# Patient Record
Sex: Female | Born: 1965 | Race: White | Hispanic: No | Smoking: Never smoker
Health system: Southern US, Community
[De-identification: ages and names within clinical notes are randomized; demographics above are authoritative.]

## PROBLEM LIST (undated history)

## (undated) DIAGNOSIS — E785 Hyperlipidemia, unspecified: Secondary | ICD-10-CM

## (undated) DIAGNOSIS — Q211 Atrial septal defect, unspecified: Secondary | ICD-10-CM

## (undated) DIAGNOSIS — I639 Cerebral infarction, unspecified: Secondary | ICD-10-CM

## (undated) HISTORY — DX: Cerebral infarction, unspecified: I63.9

## (undated) HISTORY — DX: Hyperlipidemia, unspecified: E78.5

## (undated) HISTORY — PX: BREAST EXCISIONAL BIOPSY: SUR124

---
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
## 2001-01-27 HISTORY — PX: BREAST SURGERY: SHX581

## 2006-08-07 ENCOUNTER — Ambulatory Visit (HOSPITAL_COMMUNITY): Admission: AD | Admit: 2006-08-07 | Discharge: 2006-08-07 | Payer: Self-pay | Admitting: Obstetrics and Gynecology

## 2006-08-07 ENCOUNTER — Encounter (INDEPENDENT_AMBULATORY_CARE_PROVIDER_SITE_OTHER): Payer: Self-pay | Admitting: Obstetrics and Gynecology

## 2007-07-08 ENCOUNTER — Ambulatory Visit (HOSPITAL_COMMUNITY): Admission: RE | Admit: 2007-07-08 | Discharge: 2007-07-08 | Payer: Self-pay | Admitting: Obstetrics and Gynecology

## 2008-09-12 ENCOUNTER — Ambulatory Visit (HOSPITAL_COMMUNITY): Admission: RE | Admit: 2008-09-12 | Discharge: 2008-09-12 | Payer: Self-pay | Admitting: Obstetrics and Gynecology

## 2009-01-27 DIAGNOSIS — I639 Cerebral infarction, unspecified: Secondary | ICD-10-CM

## 2009-01-27 HISTORY — DX: Cerebral infarction, unspecified: I63.9

## 2009-01-27 HISTORY — PX: PATENT FORAMEN OVALE CLOSURE: SHX2181

## 2009-02-23 DIAGNOSIS — R531 Weakness: Secondary | ICD-10-CM | POA: Insufficient documentation

## 2009-02-23 DIAGNOSIS — I639 Cerebral infarction, unspecified: Secondary | ICD-10-CM | POA: Insufficient documentation

## 2009-05-23 ENCOUNTER — Encounter (INDEPENDENT_AMBULATORY_CARE_PROVIDER_SITE_OTHER): Payer: Self-pay | Admitting: Internal Medicine

## 2009-05-24 ENCOUNTER — Encounter (INDEPENDENT_AMBULATORY_CARE_PROVIDER_SITE_OTHER): Payer: Self-pay | Admitting: Neurology

## 2009-05-25 ENCOUNTER — Ambulatory Visit: Payer: Self-pay | Admitting: Physical Medicine & Rehabilitation

## 2009-05-25 ENCOUNTER — Ambulatory Visit: Payer: Self-pay | Admitting: Vascular Surgery

## 2009-05-29 ENCOUNTER — Inpatient Hospital Stay (HOSPITAL_COMMUNITY)
Admission: RE | Admit: 2009-05-29 | Discharge: 2009-06-21 | Payer: Self-pay | Admitting: Physical Medicine & Rehabilitation

## 2009-05-30 ENCOUNTER — Ambulatory Visit: Payer: Self-pay | Admitting: Physical Medicine & Rehabilitation

## 2009-06-05 ENCOUNTER — Ambulatory Visit: Payer: Self-pay | Admitting: Physical Medicine & Rehabilitation

## 2009-06-17 ENCOUNTER — Encounter: Payer: Self-pay | Admitting: Internal Medicine

## 2009-06-17 ENCOUNTER — Ambulatory Visit: Payer: Self-pay | Admitting: Vascular Surgery

## 2009-06-19 ENCOUNTER — Encounter: Payer: Self-pay | Admitting: Internal Medicine

## 2010-01-03 ENCOUNTER — Inpatient Hospital Stay (HOSPITAL_COMMUNITY): Admission: EM | Admit: 2010-01-03 | Discharge: 2009-05-29 | Payer: Self-pay | Admitting: Internal Medicine

## 2010-02-17 ENCOUNTER — Encounter: Payer: Self-pay | Admitting: Physical Medicine & Rehabilitation

## 2010-02-18 ENCOUNTER — Encounter: Payer: Self-pay | Admitting: Obstetrics and Gynecology

## 2010-04-15 LAB — PROTIME-INR
INR: 1.03 (ref 0.00–1.49)
INR: 1.32 (ref 0.00–1.49)
INR: 1.51 — ABNORMAL HIGH (ref 0.00–1.49)
Prothrombin Time: 16.3 seconds — ABNORMAL HIGH (ref 11.6–15.2)
Prothrombin Time: 18.1 seconds — ABNORMAL HIGH (ref 11.6–15.2)

## 2010-04-15 LAB — CBC
HCT: 38.1 % (ref 36.0–46.0)
Hemoglobin: 13.5 g/dL (ref 12.0–15.0)
MCHC: 35.3 g/dL (ref 30.0–36.0)
MCV: 91.6 fL (ref 78.0–100.0)
Platelets: 167 10*3/uL (ref 150–400)
Platelets: 199 10*3/uL (ref 150–400)
RBC: 4.16 MIL/uL (ref 3.87–5.11)
RDW: 13 % (ref 11.5–15.5)
WBC: 7.3 10*3/uL (ref 4.0–10.5)

## 2010-04-15 LAB — HCG, SERUM, QUALITATIVE: Preg, Serum: NEGATIVE

## 2010-04-15 LAB — APTT: aPTT: 35 seconds (ref 24–37)

## 2010-04-16 LAB — COMPREHENSIVE METABOLIC PANEL
ALT: 21 U/L (ref 0–35)
ALT: 28 U/L (ref 0–35)
AST: 29 U/L (ref 0–37)
Albumin: 3 g/dL — ABNORMAL LOW (ref 3.5–5.2)
Albumin: 3.4 g/dL — ABNORMAL LOW (ref 3.5–5.2)
Alkaline Phosphatase: 39 U/L (ref 39–117)
BUN: 11 mg/dL (ref 6–23)
CO2: 33 mEq/L — ABNORMAL HIGH (ref 19–32)
CO2: 26 mEq/L (ref 19–32)
Calcium: 8.8 mg/dL (ref 8.4–10.5)
Calcium: 8.8 mg/dL (ref 8.4–10.5)
Chloride: 102 mEq/L (ref 96–112)
Chloride: 107 mEq/L (ref 96–112)
Creatinine, Ser: 0.87 mg/dL (ref 0.4–1.2)
Creatinine, Ser: 0.76 mg/dL (ref 0.4–1.2)
GFR calc non Af Amer: >60 mL/min (ref >60)
GFR calc non Af Amer: >60 mL/min (ref >60)
GFR calc non Af Amer: >60 mL/min (ref >60)
Glucose, Bld: 90 mg/dL (ref 70–99)
Glucose, Bld: 103 mg/dL — ABNORMAL HIGH (ref 70–99)
Glucose, Bld: 84 mg/dL (ref 70–99)
Potassium: 3.9 mEq/L (ref 3.5–5.1)
Sodium: 138 mEq/L (ref 135–145)
Total Bilirubin: 0.6 mg/dL (ref 0.3–1.2)
Total Bilirubin: 0.3 mg/dL (ref 0.3–1.2)
Total Protein: 6.3 g/dL (ref 6.0–8.3)
Total Protein: 6.5 g/dL (ref 6.0–8.3)

## 2010-04-16 LAB — DIFFERENTIAL
Basophils Absolute: 0 10*3/uL (ref 0.0–0.1)
Basophils Relative: 0 % (ref 0–1)
Eosinophils Absolute: 0 10*3/uL (ref 0.0–0.7)
Eosinophils Relative: 0 % (ref 0–5)
Lymphocytes Relative: 35 % (ref 12–46)
Lymphocytes Relative: 7 % — ABNORMAL LOW (ref 12–46)
Lymphs Abs: 0.7 10*3/uL (ref 0.7–4.0)
Monocytes Absolute: 0.5 10*3/uL (ref 0.1–1.0)
Monocytes Relative: 9 % (ref 3–12)
Monocytes Relative: 3 % (ref 3–12)
Neutro Abs: 2.7 10*3/uL (ref 1.7–7.7)
Neutro Abs: 9.9 10*3/uL — ABNORMAL HIGH (ref 1.7–7.7)
Neutrophils Relative %: 50 % (ref 43–77)
Neutrophils Relative %: 91 % — ABNORMAL HIGH (ref 43–77)

## 2010-04-16 LAB — BASIC METABOLIC PANEL
BUN: 10 mg/dL (ref 6–23)
BUN: 11 mg/dL (ref 6–23)
CO2: 35 mEq/L — ABNORMAL HIGH (ref 19–32)
Calcium: 8.7 mg/dL (ref 8.4–10.5)
Calcium: 8.4 mg/dL (ref 8.4–10.5)
Chloride: 104 mEq/L (ref 96–112)
Chloride: 105 mEq/L (ref 96–112)
Chloride: 109 mEq/L (ref 96–112)
Creatinine, Ser: 0.86 mg/dL (ref 0.4–1.2)
Creatinine, Ser: 0.96 mg/dL (ref 0.4–1.2)
Creatinine, Ser: 0.8 mg/dL (ref 0.4–1.2)
GFR calc Af Amer: >60 mL/min (ref >60)
GFR calc Af Amer: >60 mL/min (ref >60)
GFR calc Af Amer: >60 mL/min (ref >60)
GFR calc Af Amer: >60 mL/min (ref >60)
GFR calc non Af Amer: >60 mL/min (ref >60)
GFR calc non Af Amer: >60 mL/min (ref >60)
GFR calc non Af Amer: >60 mL/min (ref >60)
Glucose, Bld: 91 mg/dL (ref 70–99)
Glucose, Bld: 93 mg/dL (ref 70–99)
Potassium: 3.7 mEq/L (ref 3.5–5.1)
Potassium: 3.3 mEq/L — ABNORMAL LOW (ref 3.5–5.1)
Sodium: 141 mEq/L (ref 135–145)
Sodium: 142 mEq/L (ref 135–145)
Sodium: 144 mEq/L (ref 135–145)

## 2010-04-16 LAB — BLOOD GAS, ARTERIAL
Acid-Base Excess: 0.7 mmol/L (ref 0.0–2.0)
Bicarbonate: 24.4 mEq/L — ABNORMAL HIGH (ref 20.0–24.0)
Drawn by: 307971
TCO2: 21.9 mmol/L (ref 0–100)
pCO2 arterial: 37.7 mmHg (ref 35.0–45.0)
pH, Arterial: 7.428 — ABNORMAL HIGH (ref 7.350–7.400)

## 2010-04-16 LAB — PROTIME-INR: Prothrombin Time: 13 seconds (ref 11.6–15.2)

## 2010-04-16 LAB — CBC
HCT: 33 % — ABNORMAL LOW (ref 36.0–46.0)
HCT: 36.3 % (ref 36.0–46.0)
Hemoglobin: 11.6 g/dL — ABNORMAL LOW (ref 12.0–15.0)
Hemoglobin: 12.3 g/dL (ref 12.0–15.0)
Hemoglobin: 12.8 g/dL (ref 12.0–15.0)
MCHC: 34.2 g/dL (ref 30.0–36.0)
MCHC: 35 g/dL (ref 30.0–36.0)
MCHC: 34.4 g/dL (ref 30.0–36.0)
MCV: 91.7 fL (ref 78.0–100.0)
MCV: 90.9 fL (ref 78.0–100.0)
MCV: 91.8 fL (ref 78.0–100.0)
Platelets: 204 10*3/uL (ref 150–400)
Platelets: 212 10*3/uL (ref 150–400)
RBC: 3.61 MIL/uL — ABNORMAL LOW (ref 3.87–5.11)
RBC: 4 MIL/uL (ref 3.87–5.11)
RBC: 4.06 MIL/uL (ref 3.87–5.11)
RDW: 13.2 % (ref 11.5–15.5)
RDW: 13.4 % (ref 11.5–15.5)
WBC: 5.5 10*3/uL (ref 4.0–10.5)
WBC: 6.2 10*3/uL (ref 4.0–10.5)

## 2010-04-16 LAB — URINALYSIS, ROUTINE W REFLEX MICROSCOPIC
Bilirubin Urine: NEGATIVE
Glucose, UA: NEGATIVE mg/dL
Leukocytes, UA: NEGATIVE
Leukocytes, UA: NEGATIVE
Nitrite: NEGATIVE
Protein, ur: NEGATIVE mg/dL
Specific Gravity, Urine: 1.017 (ref 1.005–1.030)
pH: 6.5 (ref 5.0–8.0)
pH: 7 (ref 5.0–8.0)

## 2010-04-16 LAB — HEMOGLOBIN A1C
Hgb A1c MFr Bld: 5.5 % (ref <5.7)
Mean Plasma Glucose: 111 mg/dL (ref <117)

## 2010-04-16 LAB — EXTRACTABLE NUCLEAR ANTIGEN ANTIBODY
SSA (Ro) (ENA) Antibody, IgG: <0.2 AI (ref <1.0)
Scleroderma (Scl-70) (ENA) Antibody, IgG: <0.2 AI (ref <1.0)

## 2010-04-16 LAB — CARDIOLIPIN ANTIBODIES, IGG, IGM, IGA
Anticardiolipin IgA: 3 APL U/mL — ABNORMAL LOW (ref <22)
Anticardiolipin IgG: 6 GPL U/mL — ABNORMAL LOW (ref <23)

## 2010-04-16 LAB — LIPID PANEL
HDL: 65 mg/dL (ref >39)
LDL Cholesterol: 122 mg/dL — ABNORMAL HIGH (ref 0–99)
Total CHOL/HDL Ratio: 3 RATIO
Triglycerides: 46 mg/dL (ref <150)
VLDL: 9 mg/dL (ref 0–40)

## 2010-04-16 LAB — OSMOLALITY: Osmolality: 282 mOsm/kg (ref 275–300)

## 2010-04-16 LAB — URINE MICROSCOPIC-ADD ON

## 2010-04-16 LAB — CARDIAC PANEL(CRET KIN+CKTOT+MB+TROPI)
CK, MB: 4.6 ng/mL — ABNORMAL HIGH (ref 0.3–4.0)
Relative Index: 1 (ref 0.0–2.5)
Total CK: 2362 U/L — ABNORMAL HIGH (ref 7–177)
Troponin I: 0.01 ng/mL (ref 0.00–0.06)

## 2010-04-16 LAB — URINE CULTURE: Colony Count: NO GROWTH

## 2010-04-16 LAB — RAPID URINE DRUG SCREEN, HOSP PERFORMED
Barbiturates: NOT DETECTED
Barbiturates: NOT DETECTED
Benzodiazepines: NOT DETECTED

## 2010-04-16 LAB — ACETAMINOPHEN LEVEL: Acetaminophen (Tylenol), Serum: <10 ug/mL — ABNORMAL LOW (ref 10–30)

## 2010-04-16 LAB — HOMOCYSTEINE: Homocysteine: 5.1 umol/L (ref 4.0–15.4)

## 2010-04-16 LAB — C-REACTIVE PROTEIN: CRP: 0.8 mg/dL — ABNORMAL HIGH (ref <0.6)

## 2010-04-16 LAB — SALICYLATE LEVEL: Salicylate Lvl: <4 mg/dL (ref 2.8–20.0)

## 2010-04-16 LAB — ETHANOL: Alcohol, Ethyl (B): <5 mg/dL (ref 0–10)

## 2010-04-16 LAB — JO-1 ANTIBODY-IGG: Jo-1 Antibody, IgG: <0.2 AI (ref <1.0)

## 2010-04-16 LAB — FACTOR 5 LEIDEN

## 2010-04-16 LAB — SEDIMENTATION RATE: Sed Rate: 8 mm/hr (ref 0–22)

## 2010-06-11 NOTE — Op Note (Signed)
NAME:  Connie Avery, Connie Avery NO.:  [id number]   MEDICAL RECORD NO.:  [id number]          PATIENT TYPE:  AMB   LOCATION:                                FACILITY:  [facility]   PHYSICIAN:  Lenoard Aden, M.D.DATE OF BIRTH:  [birth year]   DATE OF PROCEDURE:  08/07/2006  DATE OF DISCHARGE:  08/07/2006                               OPERATIVE REPORT   PREOPERATIVE DIAGNOSIS:  Missed abortion at nine weeks.   POSTOPERATIVE DIAGNOSIS:  Missed abortion at nine weeks.   PROCEDURE:  Suction dilatation and evacuation.   SURGEON:  Lenoard Aden, M.D.   ANESTHESIA:  MAC, paracervical block.   ESTIMATED BLOOD LOSS:  Less than 50 mL.   COMPLICATIONS:  None.   DRAINS:  None.   COUNTS:  Correct.   DISPOSITION:  The patient to the recovery room in good condition.   DESCRIPTION OF PROCEDURE:  Being appraised of the risks of anesthesia,  infection, bleeding, injury to abdominal organs, need for reparative  surgery, and immediate complications to include bowel and bladder  injury.  The patient was brought to the operating room and was  administered an IV sedation without difficulty.  She was prepped and  draped in the usual sterile fashion.  Catheterized until the bladder was  empty.  Examination under anesthesia revealed an eight to 10-week size  uterus.  No adnexal masses.  The cervix is dilated easily up to a #25  Pratt dilator.  A suction curet placed.  Products of conception noted.  Repeat suction and curettage in a four-quadrant method revealed the  cavity to be empty.  Good hemostasis was noted.  All instruments were  removed.   The patient tolerated the procedure well and was transferred to the  recovery room in good condition.      Lenoard Aden, M.D.  Electronically Signed     RJT/MEDQ  D:  08/07/2006  T:  08/09/2006  Job:  [id number]

## 2010-10-23 ENCOUNTER — Ambulatory Visit: Payer: PRIVATE HEALTH INSURANCE | Admitting: Occupational Therapy

## 2010-10-23 ENCOUNTER — Ambulatory Visit: Payer: PRIVATE HEALTH INSURANCE | Attending: Cardiovascular Disease | Admitting: Physical Therapy

## 2010-10-23 DIAGNOSIS — I69998 Other sequelae following unspecified cerebrovascular disease: Secondary | ICD-10-CM | POA: Insufficient documentation

## 2010-10-23 DIAGNOSIS — R269 Unspecified abnormalities of gait and mobility: Secondary | ICD-10-CM | POA: Insufficient documentation

## 2010-10-23 DIAGNOSIS — Z5189 Encounter for other specified aftercare: Secondary | ICD-10-CM | POA: Insufficient documentation

## 2010-10-23 DIAGNOSIS — M2420 Disorder of ligament, unspecified site: Secondary | ICD-10-CM | POA: Insufficient documentation

## 2010-10-23 DIAGNOSIS — M6281 Muscle weakness (generalized): Secondary | ICD-10-CM | POA: Insufficient documentation

## 2010-10-23 DIAGNOSIS — R279 Unspecified lack of coordination: Secondary | ICD-10-CM | POA: Insufficient documentation

## 2010-10-23 DIAGNOSIS — M629 Disorder of muscle, unspecified: Secondary | ICD-10-CM | POA: Insufficient documentation

## 2010-10-29 ENCOUNTER — Ambulatory Visit: Payer: PRIVATE HEALTH INSURANCE | Attending: Cardiovascular Disease | Admitting: Occupational Therapy

## 2010-10-29 DIAGNOSIS — M6281 Muscle weakness (generalized): Secondary | ICD-10-CM | POA: Insufficient documentation

## 2010-10-29 DIAGNOSIS — I69998 Other sequelae following unspecified cerebrovascular disease: Secondary | ICD-10-CM | POA: Insufficient documentation

## 2010-10-29 DIAGNOSIS — M629 Disorder of muscle, unspecified: Secondary | ICD-10-CM | POA: Insufficient documentation

## 2010-10-29 DIAGNOSIS — R279 Unspecified lack of coordination: Secondary | ICD-10-CM | POA: Insufficient documentation

## 2010-10-29 DIAGNOSIS — M2420 Disorder of ligament, unspecified site: Secondary | ICD-10-CM | POA: Insufficient documentation

## 2010-10-29 DIAGNOSIS — R269 Unspecified abnormalities of gait and mobility: Secondary | ICD-10-CM | POA: Insufficient documentation

## 2010-10-29 DIAGNOSIS — Z5189 Encounter for other specified aftercare: Secondary | ICD-10-CM | POA: Insufficient documentation

## 2010-10-30 ENCOUNTER — Ambulatory Visit: Payer: PRIVATE HEALTH INSURANCE | Admitting: *Deleted

## 2010-11-05 ENCOUNTER — Ambulatory Visit: Payer: PRIVATE HEALTH INSURANCE | Admitting: Rehabilitative and Restorative Service Providers"

## 2010-11-05 ENCOUNTER — Ambulatory Visit: Payer: PRIVATE HEALTH INSURANCE | Admitting: Occupational Therapy

## 2010-11-06 ENCOUNTER — Ambulatory Visit: Payer: PRIVATE HEALTH INSURANCE | Admitting: Physical Therapy

## 2010-11-06 ENCOUNTER — Encounter: Payer: PRIVATE HEALTH INSURANCE | Admitting: Occupational Therapy

## 2010-11-11 ENCOUNTER — Ambulatory Visit: Payer: PRIVATE HEALTH INSURANCE | Admitting: Physical Therapy

## 2010-11-11 ENCOUNTER — Ambulatory Visit: Payer: PRIVATE HEALTH INSURANCE | Admitting: Occupational Therapy

## 2010-11-12 LAB — CBC
HCT: 35.2 — ABNORMAL LOW
Hemoglobin: 11.7 — ABNORMAL LOW
MCV: 84.2
Platelets: 260
RDW: 17.5 — ABNORMAL HIGH
WBC: 8.7

## 2010-11-12 LAB — RH IMMUNE GLOBULIN WORKUP (NOT WOMEN'S HOSP): Antibody Screen: NEGATIVE

## 2010-11-13 ENCOUNTER — Encounter: Payer: PRIVATE HEALTH INSURANCE | Admitting: Occupational Therapy

## 2010-11-13 ENCOUNTER — Ambulatory Visit: Payer: PRIVATE HEALTH INSURANCE | Admitting: Physical Therapy

## 2010-11-18 ENCOUNTER — Ambulatory Visit: Payer: PRIVATE HEALTH INSURANCE | Admitting: Physical Therapy

## 2010-11-18 ENCOUNTER — Encounter: Payer: PRIVATE HEALTH INSURANCE | Admitting: Occupational Therapy

## 2010-11-21 ENCOUNTER — Ambulatory Visit: Payer: PRIVATE HEALTH INSURANCE | Admitting: Rehabilitative and Restorative Service Providers"

## 2010-11-21 ENCOUNTER — Ambulatory Visit: Payer: PRIVATE HEALTH INSURANCE | Admitting: Occupational Therapy

## 2010-12-03 ENCOUNTER — Encounter: Payer: PRIVATE HEALTH INSURANCE | Admitting: Occupational Therapy

## 2010-12-03 ENCOUNTER — Ambulatory Visit: Payer: PRIVATE HEALTH INSURANCE | Attending: Cardiovascular Disease | Admitting: Physical Therapy

## 2010-12-03 DIAGNOSIS — R279 Unspecified lack of coordination: Secondary | ICD-10-CM | POA: Insufficient documentation

## 2010-12-03 DIAGNOSIS — R269 Unspecified abnormalities of gait and mobility: Secondary | ICD-10-CM | POA: Insufficient documentation

## 2010-12-03 DIAGNOSIS — I69998 Other sequelae following unspecified cerebrovascular disease: Secondary | ICD-10-CM | POA: Insufficient documentation

## 2010-12-03 DIAGNOSIS — M629 Disorder of muscle, unspecified: Secondary | ICD-10-CM | POA: Insufficient documentation

## 2010-12-03 DIAGNOSIS — Z5189 Encounter for other specified aftercare: Secondary | ICD-10-CM | POA: Insufficient documentation

## 2010-12-03 DIAGNOSIS — M2420 Disorder of ligament, unspecified site: Secondary | ICD-10-CM | POA: Insufficient documentation

## 2010-12-03 DIAGNOSIS — M6281 Muscle weakness (generalized): Secondary | ICD-10-CM | POA: Insufficient documentation

## 2010-12-10 ENCOUNTER — Encounter: Payer: PRIVATE HEALTH INSURANCE | Admitting: Occupational Therapy

## 2010-12-10 ENCOUNTER — Ambulatory Visit: Payer: PRIVATE HEALTH INSURANCE | Admitting: Physical Therapy

## 2010-12-17 ENCOUNTER — Encounter: Payer: PRIVATE HEALTH INSURANCE | Admitting: Occupational Therapy

## 2010-12-17 ENCOUNTER — Ambulatory Visit: Payer: PRIVATE HEALTH INSURANCE | Admitting: Rehabilitative and Restorative Service Providers"

## 2010-12-24 ENCOUNTER — Ambulatory Visit: Payer: PRIVATE HEALTH INSURANCE | Admitting: Physical Therapy

## 2010-12-31 ENCOUNTER — Ambulatory Visit: Payer: PRIVATE HEALTH INSURANCE | Attending: Cardiovascular Disease | Admitting: Physical Therapy

## 2010-12-31 DIAGNOSIS — Z5189 Encounter for other specified aftercare: Secondary | ICD-10-CM | POA: Insufficient documentation

## 2010-12-31 DIAGNOSIS — R269 Unspecified abnormalities of gait and mobility: Secondary | ICD-10-CM | POA: Insufficient documentation

## 2010-12-31 DIAGNOSIS — M629 Disorder of muscle, unspecified: Secondary | ICD-10-CM | POA: Insufficient documentation

## 2010-12-31 DIAGNOSIS — M6281 Muscle weakness (generalized): Secondary | ICD-10-CM | POA: Insufficient documentation

## 2010-12-31 DIAGNOSIS — M2420 Disorder of ligament, unspecified site: Secondary | ICD-10-CM | POA: Insufficient documentation

## 2010-12-31 DIAGNOSIS — R279 Unspecified lack of coordination: Secondary | ICD-10-CM | POA: Insufficient documentation

## 2010-12-31 DIAGNOSIS — I69998 Other sequelae following unspecified cerebrovascular disease: Secondary | ICD-10-CM | POA: Insufficient documentation

## 2011-01-07 ENCOUNTER — Ambulatory Visit: Payer: PRIVATE HEALTH INSURANCE | Admitting: Physical Therapy

## 2011-01-14 ENCOUNTER — Ambulatory Visit: Payer: PRIVATE HEALTH INSURANCE | Admitting: Physical Therapy

## 2011-11-21 ENCOUNTER — Ambulatory Visit (HOSPITAL_COMMUNITY)
Admission: RE | Admit: 2011-11-21 | Discharge: 2011-11-21 | Disposition: A | Discharge status: Home / Self Care | Payer: Medicare Other | Source: Ambulatory Visit | Attending: Cardiovascular Disease | Admitting: Cardiovascular Disease

## 2011-11-21 ENCOUNTER — Other Ambulatory Visit (HOSPITAL_COMMUNITY): Payer: Self-pay | Admitting: Cardiovascular Disease

## 2011-11-21 DIAGNOSIS — R200 Anesthesia of skin: Secondary | ICD-10-CM

## 2011-11-21 DIAGNOSIS — M5090 Cervical disc disorder, unspecified, unspecified cervical region: Secondary | ICD-10-CM | POA: Insufficient documentation

## 2011-11-21 DIAGNOSIS — R209 Unspecified disturbances of skin sensation: Secondary | ICD-10-CM | POA: Insufficient documentation

## 2012-02-13 ENCOUNTER — Other Ambulatory Visit: Payer: Self-pay | Admitting: Obstetrics and Gynecology

## 2012-02-17 ENCOUNTER — Ambulatory Visit: Payer: PRIVATE HEALTH INSURANCE | Admitting: Family

## 2012-02-18 ENCOUNTER — Ambulatory Visit: Payer: Medicare Other | Admitting: Family

## 2012-02-24 ENCOUNTER — Ambulatory Visit (INDEPENDENT_AMBULATORY_CARE_PROVIDER_SITE_OTHER): Payer: Medicare Other | Admitting: Family

## 2012-02-24 ENCOUNTER — Encounter: Payer: Self-pay | Admitting: Family

## 2012-02-24 VITALS — BP 100/60 | HR 68 | Temp 98.1°F | Resp 18 | Ht 65.5 in | Wt 127.0 lb

## 2012-02-24 DIAGNOSIS — I6350 Cerebral infarction due to unspecified occlusion or stenosis of unspecified cerebral artery: Secondary | ICD-10-CM

## 2012-02-24 DIAGNOSIS — Z8673 Personal history of transient ischemic attack (TIA), and cerebral infarction without residual deficits: Secondary | ICD-10-CM

## 2012-02-24 DIAGNOSIS — R531 Weakness: Secondary | ICD-10-CM

## 2012-02-24 DIAGNOSIS — I639 Cerebral infarction, unspecified: Secondary | ICD-10-CM

## 2012-02-24 DIAGNOSIS — M6281 Muscle weakness (generalized): Secondary | ICD-10-CM

## 2012-02-24 DIAGNOSIS — Z8774 Personal history of (corrected) congenital malformations of heart and circulatory system: Secondary | ICD-10-CM

## 2012-02-24 DIAGNOSIS — Z9889 Other specified postprocedural states: Secondary | ICD-10-CM

## 2012-02-24 NOTE — Progress Notes (Signed)
Subjective:    Patient ID: Connie Avery, female    DOB: 10/22/1965, 47 y.o.   MRN: [id number]  HPI 47 year old white female, nonsmoker, new patient to the practice is in to be established. 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 However, she communicates well. She sees gynecology for pelvic and Pap smears. Has a mammogram scheduled for next month. Patient is originally from [place]. One month prior to her having a stroke her husband died of polycythemia. She appears to be coping well and socializing well.  Patient reports having a heart surgery in 2011 that she allegedly had a whole patch and hard. Denies any chest pain, palpitations, shortness of breath or edema. Per hospital record review, patient had a large patent Foramen Ovale causing shunting that was repaired.    Review of Systems  HENT: Negative.   Respiratory: Negative.   Cardiovascular: Negative.   Gastrointestinal: Negative.   Genitourinary: Negative.   Musculoskeletal: Negative.   Skin: Negative.   Neurological: Positive for weakness. Negative for dizziness and light-headedness.       Right side residual weakness   Hematological: Negative.   Psychiatric/Behavioral: Negative.    Past Medical History  Diagnosis Date  . Stroke 2011    History   Social History  . Marital Status: Widowed    Spouse Name: N/A    Number of Children: N/A  . Years of Education: N/A   Occupational History  . Not on file.   Social History Main Topics  . Smoking status: Never Smoker   . Smokeless tobacco: Never Used  . Alcohol Use: No  . Drug Use: No  . Sexually Active: Not on file   Other Topics Concern  . Not on file   Social History Narrative  . No narrative on file    Past Surgical History  Procedure Date  . Hernia repair 1965    Left  Inguinal  . Hernia repair 1966    Umbilical  . Breast surgery 2003    Left Lumpectomy    No family history on  file.  Allergies  Allergen Reactions  . Claritin (Loratadine) Nausea And Vomiting    No current outpatient prescriptions on file prior to visit.    BP 100/60  Pulse 68  Temp 98.1 F (36.7 C) (Oral)  Resp 18  Ht 5' 5.5" (1.664 m)  Wt 127 lb (57.607 kg)  BMI 20.81 kg/m2chart    Objective:   Physical Exam  Constitutional: She is oriented to person, place, and time. She appears well-developed and well-nourished.  HENT:  Right Ear: External ear normal.  Left Ear: External ear normal.  Nose: Nose normal.  Neck: Normal range of motion. Neck supple.  Cardiovascular: Normal rate, regular rhythm and normal heart sounds.   Pulmonary/Chest: Effort normal and breath sounds normal.  Abdominal: Soft. Bowel sounds are normal.  Musculoskeletal: Normal range of motion.       Right-sided weakness. Ambulates well with a length. Grip strength  Two out of three on the right  Neurological: She is alert and oriented to person, place, and time. She has normal reflexes. No cranial nerve deficit. Coordination normal.  Skin: Skin is warm and dry.  Psychiatric: She has a normal mood and affect.          Assessment & Plan:  Assessment:  1. History of CVA 2. Foramen Ovale- Repaired (ASD) 3. Right Side Weakness  Plan: Return in 4 weeks for complete physical exam.  Continue current medications. Follow up with gynecology for GYN care.

## 2012-03-22 ENCOUNTER — Encounter: Payer: Medicare Other | Admitting: Family

## 2012-03-26 ENCOUNTER — Ambulatory Visit (INDEPENDENT_AMBULATORY_CARE_PROVIDER_SITE_OTHER): Payer: Medicare Other | Admitting: Family

## 2012-03-26 ENCOUNTER — Encounter: Payer: Self-pay | Admitting: Family

## 2012-03-26 ENCOUNTER — Other Ambulatory Visit: Payer: Self-pay | Admitting: Obstetrics and Gynecology

## 2012-03-26 VITALS — BP 110/70 | Temp 97.4°F | Wt 125.0 lb

## 2012-03-26 DIAGNOSIS — Z8673 Personal history of transient ischemic attack (TIA), and cerebral infarction without residual deficits: Secondary | ICD-10-CM

## 2012-03-26 DIAGNOSIS — Q2111 Secundum atrial septal defect: Secondary | ICD-10-CM

## 2012-03-26 DIAGNOSIS — Z1231 Encounter for screening mammogram for malignant neoplasm of breast: Secondary | ICD-10-CM

## 2012-03-26 DIAGNOSIS — M6281 Muscle weakness (generalized): Secondary | ICD-10-CM

## 2012-03-26 DIAGNOSIS — E7800 Pure hypercholesterolemia, unspecified: Secondary | ICD-10-CM

## 2012-03-26 DIAGNOSIS — Z0000 Encounter for general adult medical examination without abnormal findings: Secondary | ICD-10-CM

## 2012-03-26 LAB — LIPID PANEL
LDL Cholesterol: 126 mg/dL — ABNORMAL HIGH (ref 0–99)
Total CHOL/HDL Ratio: 4
Triglycerides: 40 mg/dL (ref 0.0–149.0)

## 2012-03-26 LAB — HEPATIC FUNCTION PANEL
ALT: 17 U/L (ref 0–35)
AST: 21 U/L (ref 0–37)
Alkaline Phosphatase: 54 U/L (ref 39–117)
Bilirubin, Direct: 0.1 mg/dL (ref 0.0–0.3)
Total Bilirubin: 0.8 mg/dL (ref 0.3–1.2)
Total Protein: 7.1 g/dL (ref 6.0–8.3)

## 2012-03-26 LAB — BASIC METABOLIC PANEL
CO2: 31 mEq/L (ref 19–32)
Chloride: 104 mEq/L (ref 96–112)
Potassium: 3.8 mEq/L (ref 3.5–5.1)
Sodium: 143 mEq/L (ref 135–145)

## 2012-03-26 LAB — CBC WITH DIFFERENTIAL/PLATELET
Basophils Absolute: 0 10*3/uL (ref 0.0–0.1)
Eosinophils Relative: 6.5 % — ABNORMAL HIGH (ref 0.0–5.0)
HCT: 35.6 % — ABNORMAL LOW (ref 36.0–46.0)
Lymphocytes Relative: 34.8 % (ref 12.0–46.0)
Lymphs Abs: 1.4 10*3/uL (ref 0.7–4.0)
Monocytes Relative: 8.8 % (ref 3.0–12.0)
Platelets: 204 10*3/uL (ref 150.0–400.0)
RDW: 13.1 % (ref 11.5–14.6)
WBC: 4 10*3/uL — ABNORMAL LOW (ref 4.5–10.5)

## 2012-03-26 LAB — TSH: TSH: 2.91 u[IU]/mL (ref 0.35–5.50)

## 2012-03-26 NOTE — Patient Instructions (Addendum)

## 2012-03-26 NOTE — Progress Notes (Signed)
Subjective:    Patient ID: Connie Avery, female    DOB: [birth year], 47 y.o.   MRN: [id number]  HPI 47 year old white female, nonsmoker is in for complete physical exam. Denies any concerns.  Reviewed all health maintenance protocols including mammography colonoscopy bone density and reviewed appropriate screening labs. Her immunization history was reviewed as well as her current medications and allergies refills of her chronic medications were given and the plan for yearly health maintenance was discussed all orders and referrals were made as appropriate.   Review of Systems  Constitutional: Negative.   HENT: Negative.   Eyes: Negative.   Respiratory: Negative.   Cardiovascular: Negative.   Gastrointestinal: Negative.   Endocrine: Negative.   Genitourinary: Negative.   Musculoskeletal: Negative.   Skin: Negative.   Allergic/Immunologic: Negative.   Neurological: Negative.   Hematological: Negative.   Psychiatric/Behavioral: Negative.    Past Medical History  Diagnosis Date  . Stroke 2011    History   Social History  . Marital Status: Widowed    Spouse Name: N/A    Number of Children: N/A  . Years of Education: N/A   Occupational History  . Not on file.   Social History Main Topics  . Smoking status: Never Smoker   . Smokeless tobacco: Never Used  . Alcohol Use: No  . Drug Use: No  . Sexually Active: Not on file   Other Topics Concern  . Not on file   Social History Narrative  . No narrative on file    Past Surgical History  Procedure Laterality Date  . Hernia repair  1965    Left  Inguinal  . Hernia repair  1966    Umbilical  . Breast surgery  2003    Left Lumpectomy    No family history on file.  Allergies  Allergen Reactions  . Claritin (Loratadine) Nausea And Vomiting    Current Outpatient Prescriptions on File Prior to Visit  Medication Sig Dispense Refill  . aspirin 81 MG tablet Take 81 mg by mouth daily.      . Multiple Vitamin  CHEW Chew 2 each by mouth daily.      . Omega-3 Fatty Acids (FISH OIL) 1000 MG CAPS Take 1 each by mouth daily.      . vitamin B-12 (CYANOCOBALAMIN) 50 MCG tablet Take 500 mcg by mouth daily.       No current facility-administered medications on file prior to visit.    BP 110/70  Temp(Src) 97.4 F (36.3 C) (Oral)  Wt 125 lb (56.7 kg)  BMI 20.48 kg/m2chart    Objective:   Physical Exam  Constitutional: She is oriented to person, place, and time. She appears well-developed and well-nourished.  HENT:  Head: Normocephalic and atraumatic.  Right Ear: External ear normal.  Left Ear: External ear normal.  Nose: Nose normal.  Mouth/Throat: Oropharynx is clear and moist.  Eyes: Conjunctivae are normal. Pupils are equal, round, and reactive to light.  Neck: Normal range of motion. Neck supple. No thyromegaly present.  Cardiovascular: Normal rate, regular rhythm and normal heart sounds.   Pulmonary/Chest: Effort normal and breath sounds normal.  Abdominal: Soft. Bowel sounds are normal.  Musculoskeletal: Normal range of motion.  Neurological: She is alert and oriented to person, place, and time. She has normal reflexes.  Skin: Skin is warm and dry.  Psychiatric: She has a normal mood and affect.          Assessment & Plan:  Assessment:  1. Complete physical  exam 2. History of atrial septal defect 3. History of stroke 4. Right side residual weakness 5. Other mammogram screening  Plan: Lab sent to include BMP, CBC, lipids, TSH, and UA. Encouraged healthy diet, exercise, self breast exams. Mammogram scheduled. Will followup with patient and the results of her labs, in one year, and as needed.

## 2012-04-12 ENCOUNTER — Telehealth: Payer: Self-pay | Admitting: Family

## 2012-04-12 NOTE — Telephone Encounter (Signed)
Patient called stating that she is returning the nurse's call concerning her labs. Please assist.

## 2012-04-12 NOTE — Telephone Encounter (Signed)
Pt aware copy mailed

## 2012-04-27 ENCOUNTER — Encounter: Payer: Self-pay | Admitting: Family Medicine

## 2012-04-27 ENCOUNTER — Ambulatory Visit (INDEPENDENT_AMBULATORY_CARE_PROVIDER_SITE_OTHER): Payer: Medicare Other | Admitting: Family Medicine

## 2012-04-27 VITALS — BP 120/76 | HR 66 | Temp 98.0°F | Resp 16 | Wt 126.0 lb

## 2012-04-27 DIAGNOSIS — I639 Cerebral infarction, unspecified: Secondary | ICD-10-CM

## 2012-04-27 DIAGNOSIS — I6350 Cerebral infarction due to unspecified occlusion or stenosis of unspecified cerebral artery: Secondary | ICD-10-CM

## 2012-04-27 DIAGNOSIS — E785 Hyperlipidemia, unspecified: Secondary | ICD-10-CM

## 2012-04-27 MED ORDER — SIMVASTATIN 20 MG PO TABS: 20.0000 mg | ORAL_TABLET | Freq: Every evening | ORAL | Status: DC

## 2012-04-27 NOTE — Progress Notes (Signed)
Subjective:     Patient ID: Connie Avery, female   DOB: 06-04-1965, 47 y.o.   MRN: [id number]  HPI Connie Avery is a 47 year old white female who is a new patient for me review of past medical history reveals a left middle cerebral artery CVA in 2011.  This was found to be an embolic stroke due to a patent foramen ovale. The patient underwent a hypercoagulable workup which was negative.  At that time she was taking hormonal contraceptives.  She subsequently underwent PFO closure.  At present she is on aspirin 81 mg by mouth daily.  Review of recent lab work shows an LDL of 126 but otherwise normal labs.  He has weakness in her right arm and right leg residual from the stroke. She also has an infected speech pattern with mild aphasia.  She is currently not working and is disabled due to the stroke.  Past Medical History  Diagnosis Date  . Stroke 2011    L MCA infarct due to emboli through PFO   Current Outpatient Prescriptions on File Prior to Visit  Medication Sig Dispense Refill  . Multiple Vitamin CHEW Chew 2 each by mouth daily.      . Omega-3 Fatty Acids (FISH OIL) 1000 MG CAPS Take 1 each by mouth daily.      . vitamin B-12 (CYANOCOBALAMIN) 50 MCG tablet Take 500 mcg by mouth daily.      Marland Kitchen aspirin 81 MG tablet Take 81 mg by mouth daily.       No current facility-administered medications on file prior to visit.   History   Social History  . Marital Status: Widowed    Spouse Name: N/A    Number of Children: N/A  . Years of Education: N/A   Occupational History  . Not on file.   Social History Main Topics  . Smoking status: Never Smoker   . Smokeless tobacco: Never Used  . Alcohol Use: No  . Drug Use: No  . Sexually Active: Not on file   Other Topics Concern  . Not on file   Social History Narrative  . No narrative on file   Family History  Problem Relation Age of Onset  . Hyperlipidemia Mother    Her brother, sister, and father are all alive and in good  health.  Review of Systems    review of systems is otherwise negative Objective:   Physical Exam  Constitutional: She is oriented to person, place, and time. She appears well-developed and well-nourished. No distress.  HENT:  Head: Normocephalic and atraumatic.  Right Ear: External ear normal.  Left Ear: External ear normal.  Mouth/Throat: Oropharynx is clear and moist.  Eyes: Conjunctivae and EOM are normal. Pupils are equal, round, and reactive to light.  Neck: Normal range of motion. Neck supple. No JVD present. No thyromegaly present.  Cardiovascular: Normal rate, regular rhythm, normal heart sounds and intact distal pulses.  Exam reveals no gallop and no friction rub.   No murmur heard. Pulmonary/Chest: Effort normal and breath sounds normal. No respiratory distress. She has no wheezes. She has no rales. She exhibits no tenderness.  Abdominal: Soft. Bowel sounds are normal. She exhibits no distension. There is no tenderness. There is no rebound and no guarding.  Lymphadenopathy:    She has no cervical adenopathy.  Neurological: She is alert and oriented to person, place, and time. She has normal reflexes. She displays normal reflexes. 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 She exhibits abnormal muscle tone (able to lift r arm but significant weakness (3/5)).  Skin: Skin is warm. No rash noted. She is not diaphoretic. No erythema. No pallor.       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 Avoid estrogen/hormone replacement therapy. Add zocor 20 mg poqhs and recheck in 3 months.  Even though this is an embolic stroke I feel she would benefit maintaining a goal LDL less than 100. Continue physical therapy.

## 2012-04-29 ENCOUNTER — Ambulatory Visit: Payer: Medicare Other

## 2012-04-29 ENCOUNTER — Ambulatory Visit
Admission: RE | Admit: 2012-04-29 | Discharge: 2012-04-29 | Disposition: A | Discharge status: Home / Self Care | Payer: Medicare Other | Source: Ambulatory Visit | Attending: Family | Admitting: Family

## 2012-04-29 DIAGNOSIS — Z1231 Encounter for screening mammogram for malignant neoplasm of breast: Secondary | ICD-10-CM

## 2012-08-02 ENCOUNTER — Ambulatory Visit: Payer: Medicare Other | Admitting: Family Medicine

## 2012-09-03 ENCOUNTER — Encounter: Payer: Self-pay | Admitting: Family Medicine

## 2012-09-03 ENCOUNTER — Ambulatory Visit (INDEPENDENT_AMBULATORY_CARE_PROVIDER_SITE_OTHER): Payer: Medicare Other | Admitting: Family Medicine

## 2012-09-03 VITALS — BP 98/62 | HR 60 | Temp 97.4°F | Resp 14 | Wt 126.0 lb

## 2012-09-03 DIAGNOSIS — I6350 Cerebral infarction due to unspecified occlusion or stenosis of unspecified cerebral artery: Secondary | ICD-10-CM

## 2012-09-03 DIAGNOSIS — I639 Cerebral infarction, unspecified: Secondary | ICD-10-CM

## 2012-09-03 DIAGNOSIS — E785 Hyperlipidemia, unspecified: Secondary | ICD-10-CM | POA: Insufficient documentation

## 2012-09-03 NOTE — Progress Notes (Signed)
Subjective:    Patient ID: Connie Avery, female    DOB: November 26, 1965, 47 y.o.   MRN: [id number]  HPI Patient has a history of embolic stroke in the territory of the left middle cerebral artery. This has left her with right-sided weakness. She has very little function with her right arm.  She also has weakness in her right leg which is mild. She does need to use a ankle brace when she walks due to a slight right foot drop caused her to trip and fall. 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  She has a difficult time with communication. She has a difficult time speaking and communicating with others. 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 Therefore she is disabled because she can no longer from these task. She has a difficult time today even communicating with me. She also has a history of hyperlipidemia. Her goal LDL is less than 100. She is currently taking simvastatin 20 mg by mouth daily. She is due to check a fasting lipid panel.  She also needs me to complete disability paperwork. Past Medical History  Diagnosis Date  . Stroke 2011    L MCA infarct due to emboli through PFO  . Hyperlipidemia    Past Surgical History  Procedure Laterality Date  . Hernia repair  1965    Left  Inguinal  . Hernia repair  1966    Umbilical  . Breast surgery  2003    Left Lumpectomy  . Patent foramen ovale closure  2011   Current Outpatient Prescriptions on File Prior to Visit  Medication Sig Dispense Refill  . aspirin 81 MG tablet Take 81 mg by mouth daily.      . Multiple Vitamin CHEW Chew 2 each by mouth daily.      . Omega-3 Fatty Acids (FISH OIL) 1000 MG CAPS Take 1 each by mouth daily.      . simvastatin (ZOCOR) 20 MG tablet Take 1 tablet (20 mg total) by mouth every evening.  30 tablet  5  . vitamin B-12 (CYANOCOBALAMIN) 50 MCG tablet Take 500 mcg by mouth daily.        No current facility-administered medications on file prior to visit.   Allergies  Allergen Reactions  . Claritin (Loratadine) Nausea And Vomiting   History   Social History  . Marital Status: Widowed    Spouse Name: N/A    Number of Children: N/A  . Years of Education: N/A   Occupational History  . Not on file.   Social History Main Topics  . Smoking status: Never Smoker   . Smokeless tobacco: Never Used  . Alcohol Use: No  . Drug Use: No  . Sexually Active: Not on file   Other Topics Concern  . Not on file   Social History Narrative  . No narrative on file      Review of Systems  All other systems reviewed and are negative.       Objective:   Physical Exam  Vitals reviewed. Constitutional: She is oriented to person, place, and time. She appears well-developed and well-nourished.  Cardiovascular: Normal rate, regular rhythm, normal heart sounds and intact distal pulses.  Exam reveals no gallop and no friction rub.   No murmur heard. Pulmonary/Chest: Effort normal and breath sounds normal. No respiratory distress. She has no wheezes. She has no rales. She exhibits no tenderness.  Abdominal: Soft.  Bowel sounds are normal. She exhibits no distension. There is no tenderness. There is no rebound and no guarding.  Neurological: She is alert and oriented to person, place, and time. She has normal reflexes. She displays normal reflexes. No cranial nerve deficit. She exhibits abnormal muscle tone. Coordination normal.   she is barely able to rest her right arm above her head. She has a difficult time flexing and extending her elbows. She is unable to extend her fingers. She is unable to shake my hand with her right hand. She has a mild right foot drop with some slight weakness in ankle dorsiflexion. 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        Assessment & Plan:  1. Stroke I gladly completed her disability forms. I asked her to  continue aspirin 81 mg by mouth daily. I asked her to return fasting for a CMP and fasting lipid panel. I like to make sure that her LDL cholesterol is less than 100 on the simvastatin 20 mg by mouth daily. Also will make sure that the statin medication is not causing any element of liver injury. - COMPLETE METABOLIC PANEL WITH GFR; Future - Lipid panel; Future

## 2012-09-07 ENCOUNTER — Other Ambulatory Visit: Payer: Medicare Other

## 2012-09-07 DIAGNOSIS — I639 Cerebral infarction, unspecified: Secondary | ICD-10-CM

## 2012-09-07 LAB — LIPID PANEL
HDL: 56 mg/dL (ref >39)
LDL Cholesterol: 69 mg/dL (ref 0–99)
Triglycerides: 39 mg/dL (ref <150)
VLDL: 8 mg/dL (ref 0–40)

## 2012-09-07 LAB — COMPLETE METABOLIC PANEL WITH GFR
ALT: 17 U/L (ref 0–35)
AST: 21 U/L (ref 0–37)
CO2: 31 mEq/L (ref 19–32)
Calcium: 9.3 mg/dL (ref 8.4–10.5)
Chloride: 106 mEq/L (ref 96–112)
Creat: 1 mg/dL (ref 0.50–1.10)
Sodium: 143 mEq/L (ref 135–145)
Total Protein: 6.7 g/dL (ref 6.0–8.3)

## 2012-10-26 ENCOUNTER — Telehealth: Payer: Self-pay | Admitting: Family Medicine

## 2012-10-26 MED ORDER — SIMVASTATIN 20 MG PO TABS: 20.0000 mg | ORAL_TABLET | Freq: Every evening | ORAL | Status: AC

## 2012-10-26 NOTE — Telephone Encounter (Signed)
Simvastatin 20 mg 1 QPM #30

## 2013-06-13 ENCOUNTER — Other Ambulatory Visit: Payer: Self-pay

## 2013-06-13 DIAGNOSIS — Z1231 Encounter for screening mammogram for malignant neoplasm of breast: Secondary | ICD-10-CM

## 2013-06-23 ENCOUNTER — Ambulatory Visit
Admission: RE | Admit: 2013-06-23 | Discharge: 2013-06-23 | Disposition: A | Discharge status: Home / Self Care | Payer: Medicare Other | Source: Ambulatory Visit

## 2013-06-23 DIAGNOSIS — Z1231 Encounter for screening mammogram for malignant neoplasm of breast: Secondary | ICD-10-CM

## 2014-05-24 ENCOUNTER — Other Ambulatory Visit: Payer: Self-pay | Admitting: Family Medicine

## 2014-05-24 DIAGNOSIS — Z1231 Encounter for screening mammogram for malignant neoplasm of breast: Secondary | ICD-10-CM

## 2014-07-04 ENCOUNTER — Ambulatory Visit
Admission: RE | Admit: 2014-07-04 | Discharge: 2014-07-04 | Disposition: A | Discharge status: Home / Self Care | Payer: Medicare HMO | Source: Ambulatory Visit | Attending: Family Medicine | Admitting: Family Medicine

## 2014-07-04 DIAGNOSIS — Z1231 Encounter for screening mammogram for malignant neoplasm of breast: Secondary | ICD-10-CM

## 2016-01-28 HISTORY — PX: BREAST BIOPSY: SHX20

## 2016-08-07 ENCOUNTER — Other Ambulatory Visit: Payer: Self-pay

## 2016-08-07 ENCOUNTER — Other Ambulatory Visit: Payer: Self-pay | Admitting: Family Medicine

## 2016-08-07 DIAGNOSIS — R921 Mammographic calcification found on diagnostic imaging of breast: Secondary | ICD-10-CM

## 2016-08-13 ENCOUNTER — Ambulatory Visit
Admission: RE | Admit: 2016-08-13 | Discharge: 2016-08-13 | Disposition: A | Discharge status: Home / Self Care | Payer: Medicare HMO | Source: Ambulatory Visit | Attending: Family Medicine | Admitting: Family Medicine

## 2016-08-13 DIAGNOSIS — R921 Mammographic calcification found on diagnostic imaging of breast: Secondary | ICD-10-CM

## 2017-06-02 ENCOUNTER — Other Ambulatory Visit: Payer: Self-pay | Admitting: Family Medicine

## 2017-06-02 DIAGNOSIS — Z1231 Encounter for screening mammogram for malignant neoplasm of breast: Secondary | ICD-10-CM

## 2017-06-04 ENCOUNTER — Ambulatory Visit
Admission: RE | Admit: 2017-06-04 | Discharge: 2017-06-04 | Disposition: A | Discharge status: Home / Self Care | Payer: Medicare HMO | Source: Ambulatory Visit | Attending: Family Medicine | Admitting: Family Medicine

## 2017-06-04 DIAGNOSIS — Z1231 Encounter for screening mammogram for malignant neoplasm of breast: Secondary | ICD-10-CM

## 2020-04-11 IMAGING — MG DIGITAL SCREENING BILATERAL MAMMOGRAM WITH TOMO AND CAD
6 of 10 series · 6 of 30 positions shown · non-contrast
Comparison: Previous exam(s).

CLINICAL DATA: Screening.

EXAM:
DIGITAL SCREENING BILATERAL MAMMOGRAM WITH TOMO AND CAD

[L MLO synth-2D]
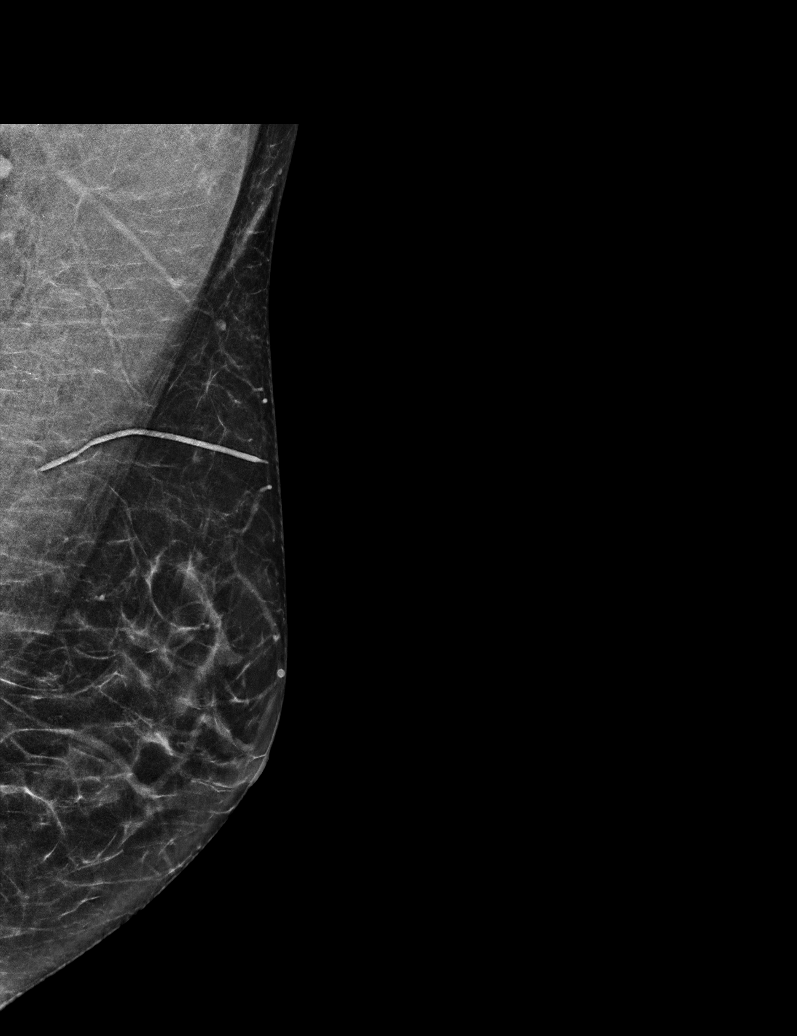

[L CC synth-2D]
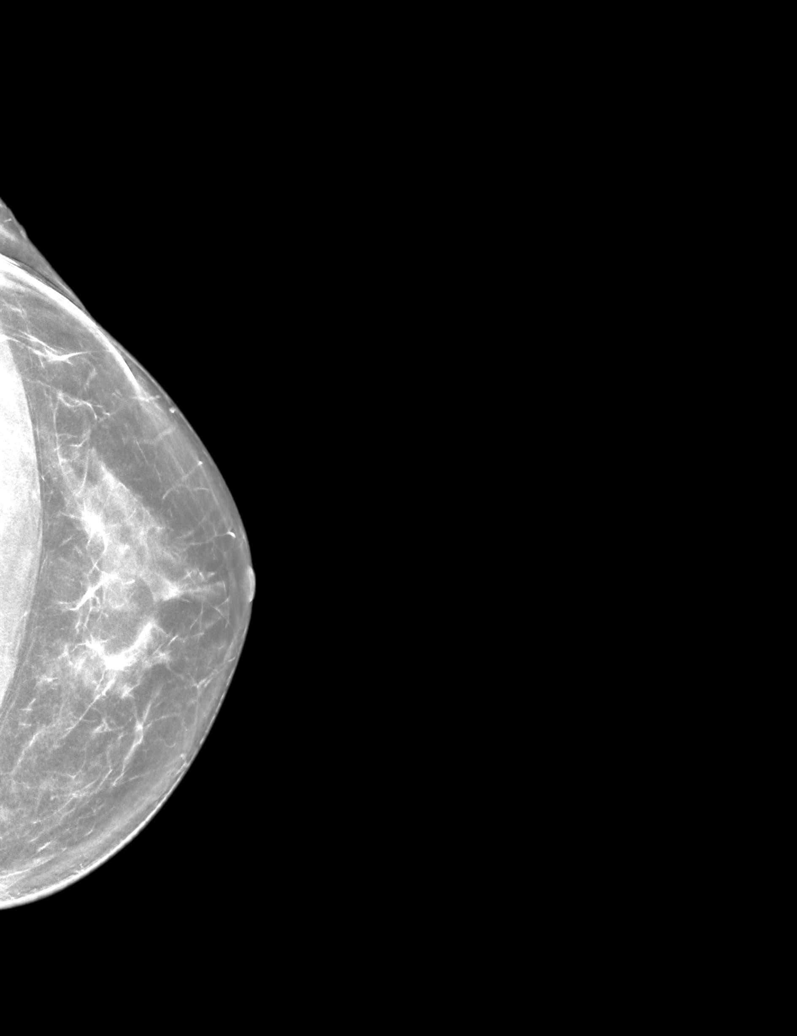

[R CC synth-2D (1 of 2)]
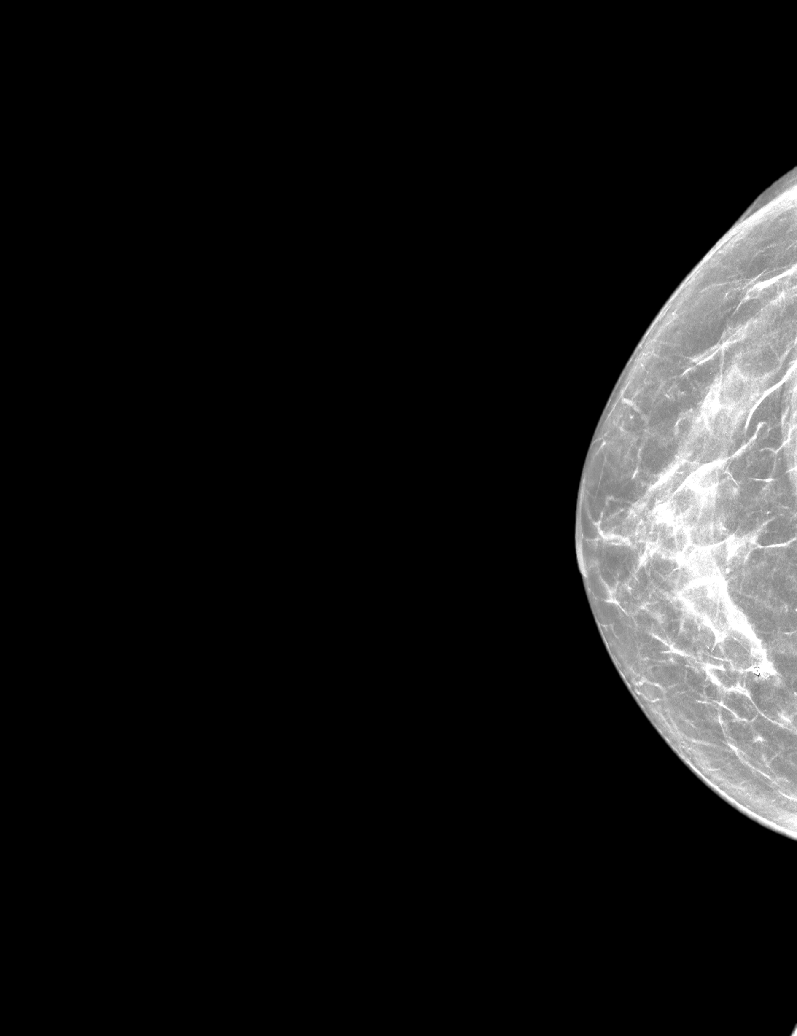

[R MLO synth-2D]
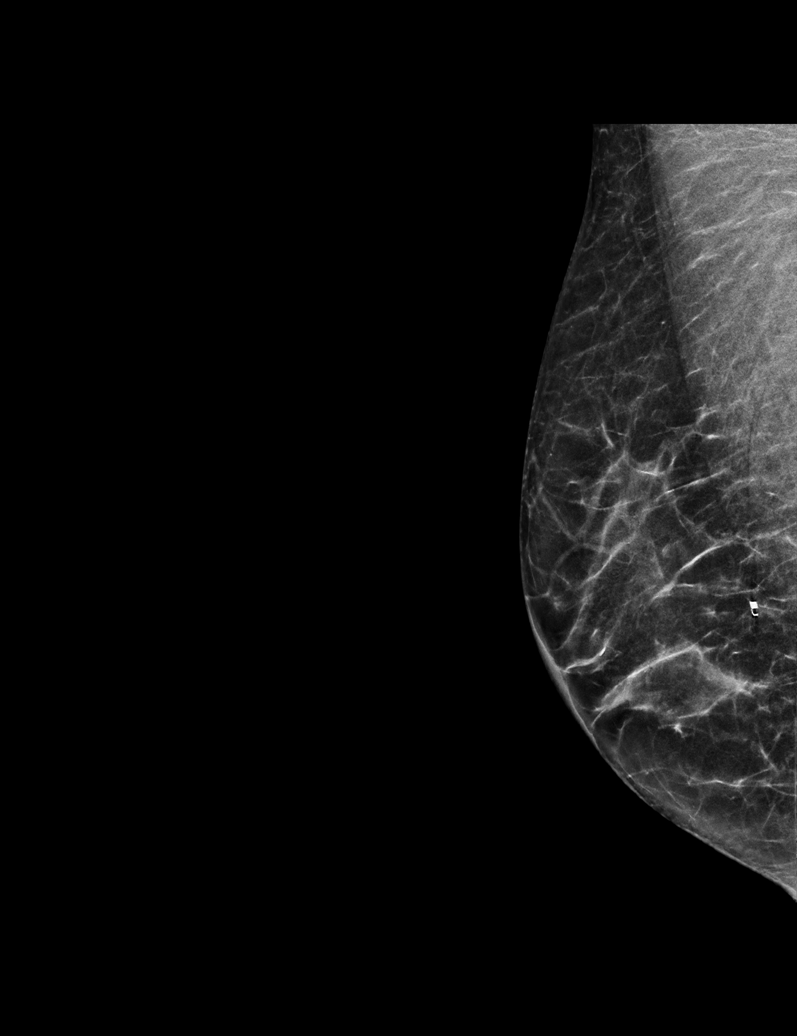

[R CC synth-2D (2 of 2)]
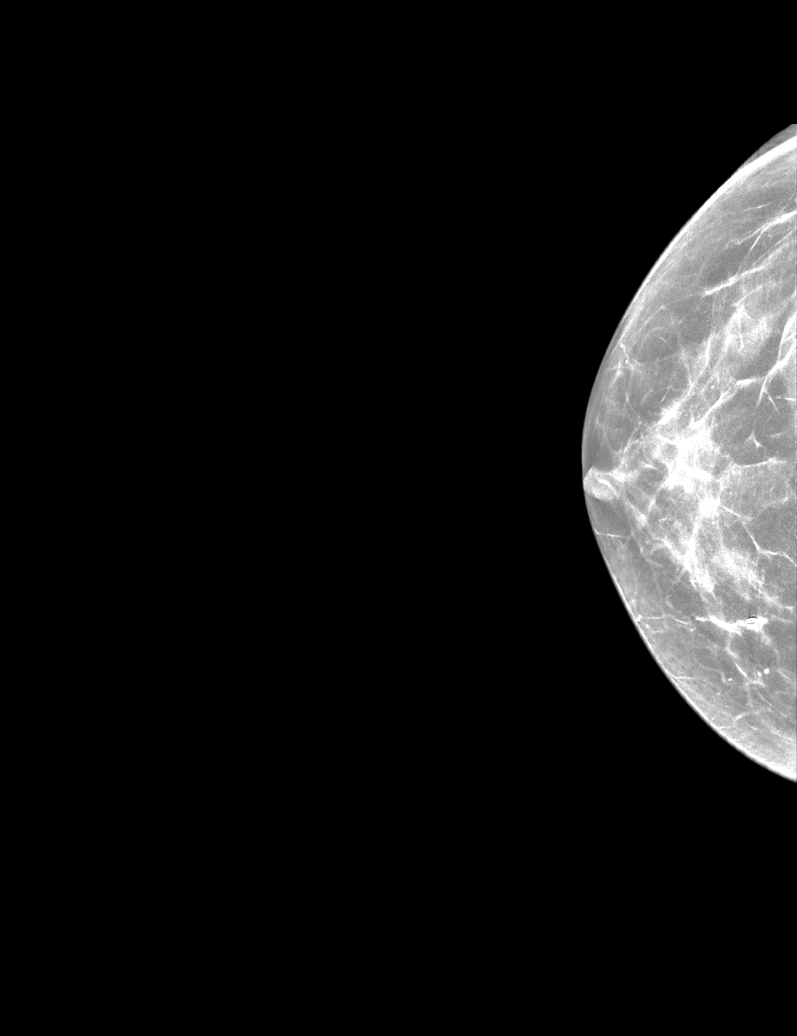

[R CC tomo · tomo slice 27/52.0]
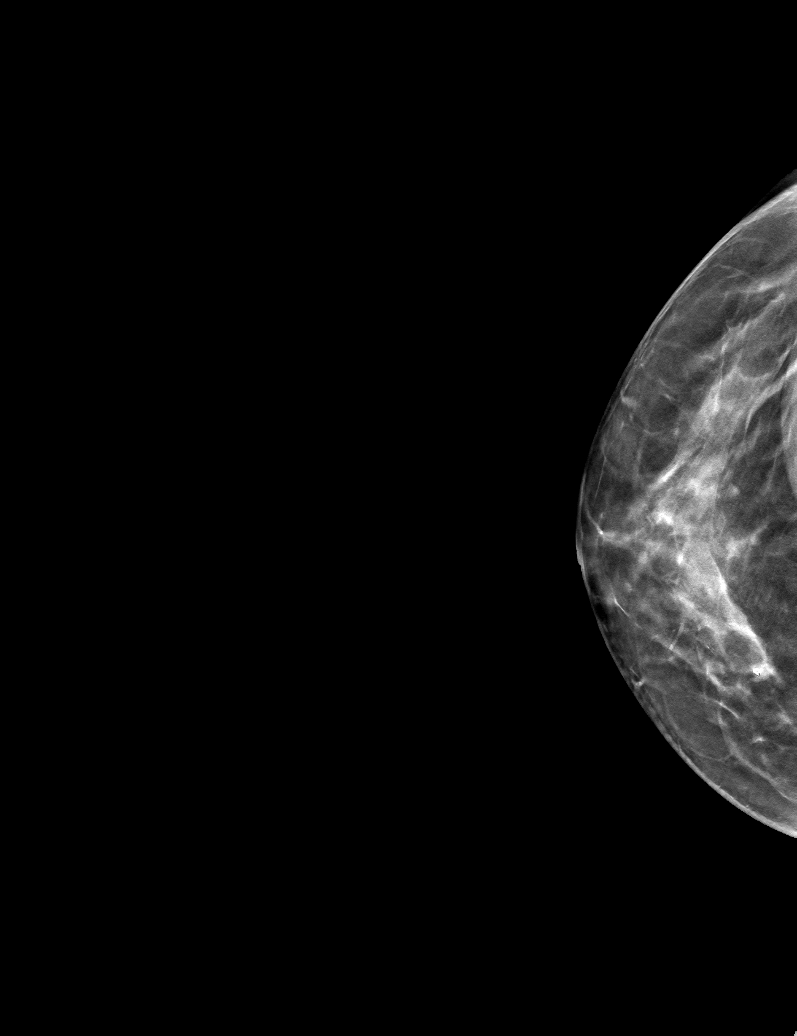

[6 of 30 positions shown; findings below may reference images not displayed]

ACR Breast Density Category b: There are scattered areas of
fibroglandular density.
FINDINGS: There are no findings suspicious for malignancy. Images were
processed with CAD.
IMPRESSION: No mammographic evidence of malignancy. A result letter of this
screening mammogram will be mailed directly to the patient.

RECOMMENDATION:
Screening mammogram in one year. (Code:CN-U-775)

BI-RADS CATEGORY  1: Negative.

## 2022-04-02 ENCOUNTER — Other Ambulatory Visit: Payer: Self-pay

## 2022-04-02 ENCOUNTER — Encounter (HOSPITAL_BASED_OUTPATIENT_CLINIC_OR_DEPARTMENT_OTHER): Payer: Self-pay

## 2022-04-02 ENCOUNTER — Emergency Department (HOSPITAL_BASED_OUTPATIENT_CLINIC_OR_DEPARTMENT_OTHER): Payer: Medicare HMO

## 2022-04-02 ENCOUNTER — Emergency Department (HOSPITAL_BASED_OUTPATIENT_CLINIC_OR_DEPARTMENT_OTHER)
Admission: EM | Admit: 2022-04-02 | Discharge: 2022-04-02 | Disposition: A | Discharge status: Home / Self Care | Payer: Medicare HMO | Attending: Emergency Medicine | Admitting: Emergency Medicine

## 2022-04-02 DIAGNOSIS — Z7982 Long term (current) use of aspirin: Secondary | ICD-10-CM | POA: Diagnosis not present

## 2022-04-02 DIAGNOSIS — W228XXA Striking against or struck by other objects, initial encounter: Secondary | ICD-10-CM | POA: Insufficient documentation

## 2022-04-02 DIAGNOSIS — M79672 Pain in left foot: Secondary | ICD-10-CM | POA: Diagnosis present

## 2022-04-02 DIAGNOSIS — S92512A Displaced fracture of proximal phalanx of left lesser toe(s), initial encounter for closed fracture: Secondary | ICD-10-CM | POA: Insufficient documentation

## 2022-04-02 MED ORDER — ACETAMINOPHEN 500 MG PO TABS
1000.0000 mg | ORAL_TABLET | Freq: Once | ORAL | Status: AC
  Administered 2022-04-02: 1000 mg via ORAL
  Filled 2022-04-02: qty 2

## 2022-04-02 NOTE — ED Triage Notes (Signed)
States ran into a piece of furniture 3 weeks ago, continues to have left pinky toe pain. Thinks she fractured it. Ambulatory with limp.

## 2022-04-02 NOTE — ED Provider Notes (Signed)
[facility] Provider Note   CSN: [id number] Arrival date & time: 04/02/22  1846     History  Chief Complaint  Patient presents with   Toe Injury    Connie Avery is a 57 y.o. female.  57 yo F with a chief complaint of left foot pain.  She tells me that she stubbed it against a piece of furniture about 3 weeks ago.  Has had continued pain and swelling.  Tried buddy taping at home without significant improvement.  Has to walk with a limp.  Denies other specific injury.  Tells me the foot was significantly bruised diffusely.        Home Medications Prior to Admission medications   Medication Sig Start Date End Date Taking? Authorizing Provider  aspirin 81 MG tablet Take 81 mg by mouth daily.    [provider]  Multiple Vitamin CHEW Chew 2 each by mouth daily.    [provider]  Omega-3 Fatty Acids (FISH OIL) 1000 MG CAPS Take 1 each by mouth daily.    [provider]  simvastatin (ZOCOR) 20 MG tablet Take 1 tablet (20 mg total) by mouth every evening. 10/26/12   Susy Frizzle, MD  vitamin B-12 (CYANOCOBALAMIN) 50 MCG tablet Take 500 mcg by mouth daily.    [provider]      Allergies    Claritin [loratadine]    Review of Systems   Review of Systems  Physical Exam Updated Vital Signs BP 131/69 (BP Location: Right Arm)   Pulse 80   Temp 98.1 F (36.7 C) (Oral)   Resp 18   Ht 5' 5.5" (1.664 m)   Wt 59 kg   SpO2 99%   BMI 21.30 kg/m  Physical Exam Vitals and nursing note reviewed.  Constitutional:      General: She is not in acute distress.    Appearance: She is well-developed. She is not diaphoretic.  HENT:     Head: Normocephalic and atraumatic.  Eyes:     Pupils: Pupils are equal, round, and reactive to light.  Cardiovascular:     Rate and Rhythm: Normal rate and regular rhythm.     Heart sounds: No murmur heard.    No friction rub. No gallop.   Pulmonary:     Effort: Pulmonary effort is normal.     Breath sounds: No wheezing or rales.  Abdominal:     General: There is no distension.     Palpations: Abdomen is soft.     Tenderness: There is no abdominal tenderness.  Musculoskeletal:        General: Swelling and tenderness present.     Cervical back: Normal range of motion and neck supple.     Comments: Pain and swelling to the left 5th digit.  No obvious pain at the base of the fifth metatarsal no pain at the navicular.  Intact pulse motor and sensation.  Skin:    General: Skin is warm and dry.  Neurological:     Mental Status: She is alert and oriented to person, place, and time.  Psychiatric:        Behavior: Behavior normal.     ED Results / Procedures / Treatments   Labs (all labs ordered are listed, but only abnormal results are displayed) Labs Reviewed - No data to display  EKG None  Radiology No results found.  Procedures Procedures    Medications Ordered in ED Medications  acetaminophen (  TYLENOL) tablet 1,000 mg (1,000 mg Oral Given 04/02/22 1913)    ED Course/ Medical Decision Making/ A&P                             Medical Decision Making Amount and/or Complexity of Data Reviewed Radiology: ordered.  Risk OTC drugs.   73 yoF with a chief complaint of left small toe pain.  She ended up stubbing it against a piece of furniture about 3 weeks ago.  Still having pain and so is here to have an x-ray performed.  Plain film of the left foot independently interpreted by me with a left fifth proximal phalanx fracture that is nondisplaced.  Will buddy tape postop shoe orthopedic follow-up.  7:20 PM:  I have discussed the diagnosis/risks/treatment options with the patient and family.  Evaluation and diagnostic testing in the emergency department does not suggest an emergent condition requiring admission or immediate intervention beyond what has been performed at this time.  They will follow up with  Ortho. We also discussed returning to the ED immediately if new or worsening sx occur. We discussed the sx which are most concerning (e.g., sudden worsening pain, fever, inability to tolerate by mouth) that necessitate immediate return. Medications administered to the patient during their visit and any new prescriptions provided to the patient are listed below.  Medications given during this visit Medications  acetaminophen (TYLENOL) tablet 1,000 mg (1,000 mg Oral Given 04/02/22 1913)     The patient appears reasonably screen and/or stabilized for discharge and I doubt any other medical condition or other [facility] requiring further screening, evaluation, or treatment in the ED at this time prior to discharge.          Final Clinical Impression(s) / ED Diagnoses Final diagnoses:  Displaced fracture of proximal phalanx of left lesser toe(s), initial encounter for closed fracture    Rx / DC Orders ED Discharge Orders     None         Deno Etienne, DO 04/02/22 1920

## 2022-04-02 NOTE — Discharge Instructions (Signed)
Keep the toe buddy taped.  Use the postop shoe for comfort.  Have given you orthopedic follow-up.  Follow with your family doctor.  Take 4 over the counter ibuprofen tablets 3 times a day or 2 over-the-counter naproxen tablets twice a day for pain. Also take tylenol '1000mg'$ (2 extra strength) four times a day.

## 2023-02-09 NOTE — Progress Notes (Signed)
 Patient Name: Connie Avery [facility] Patient Date of Birth: [birth year] Patient MR#: [id number]  PCP: Lynwood Sullivan Barge, MD Date: 02/09/2023    [facility]   Assessment and Plan:    1. Palpitation  ECG 12 lead   Transthoracic echo (TTE) complete   Holter monitor - 8-14 days   Magnesium    2. Cerebrovascular accident (CVA), unspecified mechanism (CMD)  Transthoracic echo (TTE) complete    3. History of atrial septal defect repair  Transthoracic echo (TTE) complete       ASSESSMENT & PLAN:   Benign sounding palpitations-I am suspecting probably just a few APD's but given her history of a repaired PFO we do need to check for possible residual shunt which could potentially predispose to atrial arrhythmias.  2 weeks Zio ordered.  History of PFO repair with a pericardial patch 2011-will check a TTE with a special focus on assessing the integrity of the PFO repair with color and agitated saline bubble study.  FOLLOW UP: The patient will come back to see me in 3 months if abnormalities are found or as needed if everything looks okay.  Thank you for your kind referral. I appreciate being able to take care of your patient.  Erna Ardelia Creighton MD   Subjective:    Reason for Consult/Visit: Establish Care    History of Present Illness: Connie Avery Floor is a 58 y.o. female who presents for initial evaluation 02/09/23.  History of Present Illness The patient presents for evaluation of palpitations.  She reports experiencing intermittent episodes of palpitations, characterized by a few rapid heartbeats followed by a brief pause. These episodes are not associated with any syncope or presyncope. She does not experience prolonged periods of tachycardia, with the episodes lasting only a few beats. She has a history of patent foramen ovale (PFO) closure with a pericardial patch but reports no chest pain during physical  activity, no episodes of waking up gasping for air, and no lower extremity edema. Her sleep was disrupted the previous night. She abstains from alcohol consumption.  SOCIAL HISTORY She does not drink alcohol.  FAMILY HISTORY There is no family history of dangerous heart rhythms, sudden death at a young age, or defibrillator implantation.  Pertinent cardiac testing results:   The following portions of the patient's history were reviewed and updated as appropriate:  Medical History: Past Medical History:  Diagnosis Date  . Patent foramen ovale   . Stroke (CMD) 2011   some right sided weakness     Family History: Family History  Problem Relation Name Age of Onset  . Pancreatic cancer Maternal Grandmother    . Colon cancer Maternal Grandfather    . Colon cancer Paternal Grandmother    . Thyroid disease Mother    . Polymyalgia rheumatica Mother    . Stroke Neg Hx    . Breast cancer Neg Hx    . Ovarian cancer Neg Hx    . Endometrial cancer Neg Hx    . Anesthesia problems Neg Hx    . Arthritis Neg Hx    . Asthma Neg Hx    . Cancer Neg Hx    . Cerebral palsy Neg Hx    . Clotting disorder Neg Hx    . Club foot Neg Hx    . Collagen disease Neg Hx    . Deep vein thrombosis Neg Hx    . Diabetes Neg Hx    .  Gait disorder Neg Hx    . Gout Neg Hx    . Heart disease Neg Hx    . Hip dysplasia Neg Hx    . Hip fracture Neg Hx    . Hypermobility Neg Hx    . Osteoporosis Neg Hx    . Other Neg Hx    . Pulmonary disease Neg Hx    . Rheumatologic disease Neg Hx    . Scoliosis Neg Hx    . Spina bifida Neg Hx    . Vasculitis Neg Hx      Medications:  Current Outpatient Medications  Medication Sig Dispense Refill  . coenzyme Q-10 10 mg capsule Take 10 mg by mouth Once Daily.    . cyanocobalamin (VITAMIN B12) 1,000 mcg subl SL tablet Place  under the tongue.    . elderberry fruit (Sambucus Elderberry Original) 50 mg/5 mL syrp Take  by mouth.    . fluticasone propionate (FLONASE)  50 mcg/spray nasal spray Administer 2 sprays into each nostril daily. 16 g 11  . multivitamin (Chewable-Vite) chew chewable tablet Take 1 tablet by mouth Once Daily.    . simvastatin  (ZOCOR ) 20 mg tablet TAKE 1 TABLET EVERY DAY 90 tablet 3  . turm-ging-bos-yuc-wil-cham-hor 100-100-100-125 mg tab Take by mouth.    . zinc gluconate 50 mg tab tablet Take 50 mg by mouth Once Daily.     No current facility-administered medications for this visit.    Allergies: Allergies  Allergen Reactions  . Loratadine Anaphylaxis    Social History: Social History   Tobacco Use  Smoking Status Never  Smokeless Tobacco Never   Social History   Substance and Sexual Activity  Alcohol Use Never   Social History   Substance and Sexual Activity  Drug Use No    Review of Systems: A complete ROS performed with pertinent positives as per HPI      Objective:       Physical Exam:  VITAL SIGNS:  Blood pressure 124/88, pulse 76, height 1.676 m (5' 6), weight 60.4 kg (133 lb 3.2 oz), SpO2 98%. Wt Readings from Last 3 Encounters:  02/09/23 60.4 kg (133 lb 3.2 oz)  01/26/23 59 kg (130 lb)  01/15/23 59.9 kg (132 lb)   Body mass index is 21.5 kg/m.  General: WD WN C woman in NAD   HEENT:   PERRL, EOMI.  Oropharynx is moist. No icterus.   Neck: Carotid pulses are 2+.  No bruits.  No adenopathy, thyromegaly or masses.  Jugular venous pressure is normal. No HJR   Lungs:   Respirations are unlabored. The lungs are clear in all fields bilaterally without rales, rhonchi or wheezing.    Percussion note is normal throughout.   Heart:  Heart sounds are regular rate and rhythm  Normal S1. Normal S2.  No S3 or S4.  No R/M/G   Abdomen:   Soft, nontender and nondistended.  There are normal bowel sounds.  No bruits.  No masses or organomegaly.    Extremities: Warm. No rashes or ulcers. No lower extremity pitting edema.   Pulses: Radial DP and PT pulses are 2+ and symmetrical.   Skin: No lower  extremity rashes or ulcers   Neurologic:  The patient is awake, alert, and oriented to time, place, person and situation, and no strength deficits.     EKG: 02/09/23 NSR 70 bpm, normal  Lab Review: Lab Results  Component Value Date   WBC 5.10 01/08/2023   RBC 4.28 01/08/2023  HGB 13.5 01/08/2023   HCT 39.4 01/08/2023   MCV 91.9 01/08/2023   MCH 31.4 01/08/2023   MCHC 34.2 01/08/2023   RDW 12.8 01/08/2023   PLT 212 01/08/2023   MPV 8.9 01/08/2023  . Lab Results  Component Value Date   NA 143 01/08/2023   Connie 4.3 01/08/2023   CL 104 01/08/2023   BUN 11 01/08/2023   CREATININE 0.92 01/08/2023   Lab Results  Component Value Date   TSH 2.550 01/08/2023      CVD RISK STRATIFICATION RESULTS: Lab Results  Component Value Date   CHOL 165 01/08/2023   HDL 61 01/08/2023   TRIG 75 01/08/2023    The ASCVD Risk score (Arnett DK, et al., 2019) failed to calculate for the following reasons:   Risk score cannot be calculated because patient has a medical history suggesting prior/existing ASCVD

## 2023-07-23 ENCOUNTER — Other Ambulatory Visit: Payer: Self-pay

## 2023-07-23 ENCOUNTER — Emergency Department (HOSPITAL_BASED_OUTPATIENT_CLINIC_OR_DEPARTMENT_OTHER)
Admission: EM | Admit: 2023-07-23 | Discharge: 2023-07-24 | Disposition: A | Discharge status: Home / Self Care | Attending: Emergency Medicine | Admitting: Emergency Medicine

## 2023-07-23 ENCOUNTER — Emergency Department (HOSPITAL_BASED_OUTPATIENT_CLINIC_OR_DEPARTMENT_OTHER)

## 2023-07-23 ENCOUNTER — Encounter (HOSPITAL_BASED_OUTPATIENT_CLINIC_OR_DEPARTMENT_OTHER): Payer: Self-pay

## 2023-07-23 DIAGNOSIS — Z7982 Long term (current) use of aspirin: Secondary | ICD-10-CM | POA: Diagnosis not present

## 2023-07-23 DIAGNOSIS — M79641 Pain in right hand: Secondary | ICD-10-CM | POA: Diagnosis not present

## 2023-07-23 DIAGNOSIS — S62326A Displaced fracture of shaft of fifth metacarpal bone, right hand, initial encounter for closed fracture: Secondary | ICD-10-CM | POA: Diagnosis not present

## 2023-07-23 DIAGNOSIS — W19XXXA Unspecified fall, initial encounter: Secondary | ICD-10-CM | POA: Diagnosis not present

## 2023-07-23 DIAGNOSIS — S6991XA Unspecified injury of right wrist, hand and finger(s), initial encounter: Secondary | ICD-10-CM | POA: Diagnosis present

## 2023-07-23 DIAGNOSIS — Y92832 Beach as the place of occurrence of the external cause: Secondary | ICD-10-CM | POA: Insufficient documentation

## 2023-07-23 NOTE — ED Triage Notes (Signed)
 Pt arrives with c/o right hand swelling after she injured her pinky finger a few weeks ago. Pt denies new injury.

## 2023-07-23 NOTE — ED Provider Notes (Signed)
 [place] [facility] Provider Note   CSN: [id number] Arrival date & time: 07/23/23  2001     Patient presents with: Hand Pain   Connie Avery is a 58 y.o. female.   The history is provided by the patient.  Hand Pain  Connie Avery is a 58 y.o. female who presents to the Emergency Department complaining of hand injury.  She states that 2 [place] and [place] her right fifth digit and has had a splint on it for that amount of time.  Her finger was itching so she went to pull off the splint today and now she has increased pain at the area.  She does not recall any discrete injury.  She does have a history of prior CVA and has decreased sensation to the right side.  She is on aspirin.  CVA was secondary to PFO 15 years ago.  PFO status post repair.  She was formally right-handed but has been left-handed for the last 15 years.        Prior to Admission medications   Medication Sig Start Date End Date Taking? Authorizing Provider  aspirin 81 MG tablet Take 81 mg by mouth daily.    [provider]  Multiple Vitamin CHEW Chew 2 each by mouth daily.    [provider]  Omega-3 Fatty Acids (FISH OIL) 1000 MG CAPS Take 1 each by mouth daily.    [provider]  simvastatin  (ZOCOR ) 20 MG tablet Take 1 tablet (20 mg total) by mouth every evening. 10/26/12   Duanne Butler DASEN, MD  vitamin B-12 (CYANOCOBALAMIN) 50 MCG tablet Take 500 mcg by mouth daily.    [provider]    Allergies: Claritin [loratadine]    Review of Systems  All other systems reviewed and are negative.   Updated Vital Signs BP 111/78   Pulse 67   Temp 98 F (36.7 C) (Oral)   Resp 18   Wt 63.5 kg   SpO2 97%   BMI 22.94 kg/m   Physical Exam Constitutional:      Appearance: Normal appearance.  HENT:     Head: Normocephalic and atraumatic.   Cardiovascular:     Rate and Rhythm: Normal  rate.  Pulmonary:     Effort: Pulmonary effort is normal. No respiratory distress.   Musculoskeletal:     Comments: Soft tissue swelling, ecchymosis and tenderness over the ulnar aspect of the right hand.  She is able to flex and extend the digits.  She does have some scissoring of the fifth digit.  2+ right radial pulse.   Skin:    General: Skin is warm and dry.     Capillary Refill: Capillary refill takes less than 2 seconds.   Neurological:     Mental Status: She is alert.   Psychiatric:        Mood and Affect: Mood normal.     (all labs ordered are listed, but only abnormal results are displayed) Labs Reviewed - No data to display  EKG: None  Radiology: DG Hand Complete Right Result Date: 07/23/2023 CLINICAL DATA:  Hand pain and swelling. EXAM: RIGHT HAND - COMPLETE 3+ VIEW COMPARISON:  None Available. FINDINGS: There is an acute oblique fracture through the mid fifth metacarpal. There is 1/2 shaft with lateral displacement of the distal fracture fragment. There is overlying soft tissue swelling. There is no dislocation. IMPRESSION: Acute displaced fracture through the mid fifth  metacarpal. Electronically Signed   By: Greig Pique M.D.   On: 07/23/2023 21:14     Procedures  SPLINT APPLICATION Date/Time: 5:09 AM Authorized by: Almarie Burner Consent: Verbal consent obtained. Risks and benefits: risks, benefits and alternatives were discussed Consent given by: patient Splint applied by: ED technician Location details: RUE Splint type: ulnar gutter Supplies used: orthoglass, ace wrap Post-procedure: The splinted body part was neurovascularly unchanged following the procedure. Patient tolerance: Patient tolerated the procedure well with no immediate complications.   Medications Ordered in the ED - No data to display                                  Medical Decision Making Amount and/or Complexity of Data Reviewed Radiology: ordered.   Patient here for  evaluation of right hand pain, no clear trauma although she does have decreased sensation to this hand.  She does have fifth metacarpal fracture that is displaced.  Images personally reviewed and interpreted, agree with radiologist interpretation.  She was placed in ulnar gutter splint with plan for orthopedics follow-up and return precautions.     Final diagnoses:  Displaced fracture of shaft of fifth metacarpal bone, right hand, initial encounter for closed fracture    ED Discharge Orders     None          Burner Almarie, MD 07/24/23 [phone]

## 2023-08-03 ENCOUNTER — Other Ambulatory Visit: Payer: Self-pay | Admitting: Orthopedic Surgery

## 2023-08-03 ENCOUNTER — Other Ambulatory Visit: Payer: Self-pay

## 2023-08-03 ENCOUNTER — Encounter (HOSPITAL_BASED_OUTPATIENT_CLINIC_OR_DEPARTMENT_OTHER): Payer: Self-pay | Admitting: Orthopedic Surgery

## 2023-08-03 NOTE — Progress Notes (Signed)
 Orthopaedic Surgery Hand and Upper Extremity History and Physical Examination  CC: Right 5th MC fracture  HPI 08/03/2023: Connie Avery is a 58 y.o. female previously seen by Zac for treatment of her right small finger boutonniere deformity. She presents today for evaluation of a right 5th metacarpal fracture. States that the injury occurred on 07/23/23 when she pulled very hard to get her right small finger splint off. She is not sure if when she pulled off the splint she hit her hand on anything. She was seen in the ED where x-rays were done showing the fracture. She was placed in an ulnar gutter splint and advised to follow up with our office. She is not having any pain.   Of note, her records show that she suffered a CVA 15 years ago secondary to a PFO. PFO is s/p repair. She takes ASA 81mg . She states that since the CVA she has had decreased sensation in the right hand.    Problem List:  Problem List[1]  Past Medical History: Medical History[2]   Medications: Current Rx ordered in [facility][3]  Allergies: Allergies as of 08/03/2023 - Reviewed 08/03/2023  Allergen Reaction Noted  . Loratadine Anaphylaxis 12/31/2010    Past Surgical History: Surgical History[4]   Social History: Social History   Occupational History  . Not on file  Tobacco Use  . Smoking status: Never  . Smokeless tobacco: Never  Vaping Use  . Vaping status: Never Used  Substance and Sexual Activity  . Alcohol use: Never  . Drug use: No  . Sexual activity: Yes    Birth control/protection: Post-menopausal     Family History: Family History[5] Otherwise, no relevant orthopaedic family history  ROS: Review of Systems: All systems reviewed and are negative except that mentioned in HPI  Work/Sport/Hobbies: See HPI  Physical Examination: There were no vitals filed for this visit. Constitutional: Awake, alert.  WN/WD Appearance: healthy, no acute distress, well-groomed Affect:  Normal HEENT: EOMI, mucous membranes moist CV: RRR Pulm: breathing comfortably  Right Upper Extremity / Hand Significant swelling along the ulnar portion of the hand with significant ecchymosis in the palmar and dorsal surfaces. No significant rotational deformity of the small finger when making a fist. NTTP.   Motor: intact in AIN, PIN, and ulnar nerve distributions Vascular: Fingers warm and well perfused, palpable radial pulse, brisk capillary refill   Imaging: I have personally reviewed the following studies: Right hand x-rays show displaced oblique fracture of the shaft of the 5th MC.   Other studies: DEXA 2019 - normal   Assessment/Plan: 1. Displaced oblique right 5th MC shaft fracture: acute  - Discussed nonoperative management with bracing vs operative management with open reduction and screw fixation at length today. The decision was made today to move forward with surgery. We had an extensive conversation about what to expect during the pre op, intra op, and post op periods. Risks, benefits, and alternatives of surgery were discussed including risks of blood loss, infection, damage to nerves/vessels/tendons/ligaments/bone, failure of surgery, need for additional surgery, complications with wound healing, stiffness, continued pain.  She voiced understanding of these risks and elected to proceed.  We will work on having this arranged.  She agrees with the plan of care. She was placed back in the ulnar gutter splint, which she will wear at all times until surgery.  - Discussed that she should have a repeat DEXA scan done given the severity of her fracture with minimal trauma. She would like to follow  up with her PCP for this, if they are not able to manage this we can refer her to Dr. Midge.    The patient is acceptable for ambulatory surgery center.   Betty Anton, PA-C [facility] Department of Orthopaedic Surgery 08/03/2023 1:16 PM       [1] Patient  Active Problem List Diagnosis  . Spasticity  . Cerebrovascular accident (HCC)  . Spastic hemiplegia affecting dominant side (HCC)  . History of atrial septal defect repair  . Other microscopic hematuria  . Plantar warts  . Primary osteoarthritis of right knee  . Hemiplegia affecting nondominant side    (CMD)  . Skin neoplasm  . Internal derangement of knee, right  . Hyperlipidemia  . Right sided weakness  . Acute medial meniscus tear of right knee  . Tear of medial meniscus of right knee, current  . Aftercare following surgery of the musculoskeletal system  . Numbness of left foot  [2] Past Medical History: Diagnosis Date  . Patent foramen ovale (CMD)   . Stroke    (CMD) 2011   some right sided weakness  [3] Meds Ordered in [facility]  Medication Sig Dispense Refill  . coenzyme Q-10 10 mg capsule Take 10 mg by mouth Once Daily.    . cyanocobalamin (VITAMIN B12) 1,000 mcg subl SL tablet Place  under the tongue.    . elderberry fruit (Sambucus Elderberry Original) 50 mg/5 mL syrp Take  by mouth.    . fluticasone propionate (FLONASE) 50 mcg/spray nasal spray Administer 2 sprays into each nostril daily. 16 g 11  . multivitamin (Chewable-Vite) chew chewable tablet Take 1 tablet by mouth Once Daily.    . ondansetron  (ZOFRAN ) 8 mg tablet Take 1 tablet (8 mg total) by mouth every 8 (eight) hours as needed for nausea or vomiting. 20 tablet 0  . simvastatin  (ZOCOR ) 5 mg tablet Take by mouth.    . turm-ging-bos-yuc-wil-cham-hor 100-100-100-125 mg tab Take by mouth.    . zinc gluconate 50 mg tab tablet Take 50 mg by mouth Once Daily.     No current Epic-ordered facility-administered medications on file.  [4] Past Surgical History: Procedure Laterality Date  . BREAST BIOPSY Left    Procedure: BREAST BIOPSY; benign x2  . BREAST BIOPSY Right 08/17/2015   Procedure: BREAST BIOPSY; stereo, attempted calcs not visualized  . BREAST BIOPSY Right 08/13/2016   Procedure: BREAST BIOPSY; bg  Pathology revealed fibrocystic change and adenosis with  . COLONOSCOPY     Procedure: COLONOSCOPY  . HERNIA REPAIR     Procedure: HERNIA REPAIR; twice - inguial and umbilical  . KNEE SURGERY Right 11/27/2021   Procedure: KNEE SURGERY  . OTHER SURGICAL HISTORY     Procedure: OTHER SURGICAL HISTORY (slow to wake up from anesthesia)  . PATENT FORAMEN OVALE CLOSURE  2011   Procedure: PATENT FORAMEN OVALE CLOSURE  [5] Family History Problem Relation Name Age of Onset  . Pancreatic cancer Maternal Grandmother    . Colon cancer Maternal Grandfather    . Colon cancer Paternal Grandmother    . Thyroid disease Mother    . Polymyalgia rheumatica Mother    . Stroke Neg Hx    . Breast cancer Neg Hx    . Ovarian cancer Neg Hx    . Endometrial cancer Neg Hx    . Anesthesia problems Neg Hx    . Arthritis Neg Hx    . Asthma Neg Hx    . Cancer Neg Hx    .  Cerebral palsy Neg Hx    . Clotting disorder Neg Hx    . Club foot Neg Hx    . Collagen disease Neg Hx    . Deep vein thrombosis Neg Hx    . Diabetes Neg Hx    . Gait disorder Neg Hx    . Gout Neg Hx    . Heart disease Neg Hx    . Hip dysplasia Neg Hx    . Hip fracture Neg Hx    . Hypermobility Neg Hx    . Osteoporosis Neg Hx    . Other Neg Hx    . Pulmonary disease Neg Hx    . Rheumatologic disease Neg Hx    . Scoliosis Neg Hx    . Spina bifida Neg Hx    . Vasculitis Neg Hx

## 2023-08-04 ENCOUNTER — Other Ambulatory Visit: Payer: Self-pay

## 2023-08-04 ENCOUNTER — Encounter (HOSPITAL_BASED_OUTPATIENT_CLINIC_OR_DEPARTMENT_OTHER): Payer: Self-pay | Admitting: Orthopedic Surgery

## 2023-08-04 ENCOUNTER — Ambulatory Visit (HOSPITAL_BASED_OUTPATIENT_CLINIC_OR_DEPARTMENT_OTHER): Admitting: Anesthesiology

## 2023-08-04 ENCOUNTER — Encounter (HOSPITAL_BASED_OUTPATIENT_CLINIC_OR_DEPARTMENT_OTHER)
Admission: RE | Disposition: A | Discharge status: Home / Self Care | Payer: Self-pay | Source: Home / Self Care | Attending: Orthopedic Surgery

## 2023-08-04 ENCOUNTER — Ambulatory Visit (HOSPITAL_BASED_OUTPATIENT_CLINIC_OR_DEPARTMENT_OTHER)

## 2023-08-04 ENCOUNTER — Ambulatory Visit (HOSPITAL_BASED_OUTPATIENT_CLINIC_OR_DEPARTMENT_OTHER)
Admission: RE | Admit: 2023-08-04 | Discharge: 2023-08-04 | Disposition: A | Discharge status: Home / Self Care | Attending: Orthopedic Surgery | Admitting: Orthopedic Surgery

## 2023-08-04 DIAGNOSIS — S62326A Displaced fracture of shaft of fifth metacarpal bone, right hand, initial encounter for closed fracture: Secondary | ICD-10-CM | POA: Insufficient documentation

## 2023-08-04 DIAGNOSIS — Z01818 Encounter for other preprocedural examination: Secondary | ICD-10-CM

## 2023-08-04 DIAGNOSIS — X58XXXA Exposure to other specified factors, initial encounter: Secondary | ICD-10-CM | POA: Insufficient documentation

## 2023-08-04 DIAGNOSIS — I69398 Other sequelae of cerebral infarction: Secondary | ICD-10-CM | POA: Insufficient documentation

## 2023-08-04 HISTORY — DX: Atrial septal defect, unspecified: Q21.10

## 2023-08-04 HISTORY — PX: OPEN REDUCTION INTERNAL FIXATION (ORIF) METACARPAL: SHX6234

## 2023-08-04 SURGERY — OPEN REDUCTION INTERNAL FIXATION (ORIF) METACARPAL
Anesthesia: Monitor Anesthesia Care | Site: Hand | Laterality: Right

## 2023-08-04 MED ORDER — DEXMEDETOMIDINE HCL IN NACL 80 MCG/20ML IV SOLN
INTRAVENOUS | Status: DC | PRN
  Administered 2023-08-04 (×2): 4 ug via INTRAVENOUS

## 2023-08-04 MED ORDER — ROPIVACAINE HCL 5 MG/ML IJ SOLN
INTRAMUSCULAR | Status: DC | PRN
  Administered 2023-08-04: 20 mL via PERINEURAL

## 2023-08-04 MED ORDER — LIDOCAINE HCL (CARDIAC) PF 100 MG/5ML IV SOSY
PREFILLED_SYRINGE | INTRAVENOUS | Status: DC | PRN
  Administered 2023-08-04: 20 mg via INTRAVENOUS

## 2023-08-04 MED ORDER — DEXAMETHASONE SODIUM PHOSPHATE 10 MG/ML IJ SOLN
INTRAMUSCULAR | Status: DC | PRN
  Administered 2023-08-04: 10 mg

## 2023-08-04 MED ORDER — MIDAZOLAM HCL 2 MG/2ML IJ SOLN
2.0000 mg | Freq: Once | INTRAMUSCULAR | Status: AC
Start: 2023-08-04 — End: 2023-08-04
  Administered 2023-08-04: 2 mg via INTRAVENOUS

## 2023-08-04 MED ORDER — ACETAMINOPHEN 500 MG PO TABS
1000.0000 mg | ORAL_TABLET | Freq: Once | ORAL | Status: AC
  Administered 2023-08-04: 1000 mg via ORAL

## 2023-08-04 MED ORDER — KETOROLAC TROMETHAMINE 30 MG/ML IJ SOLN: 30.0000 mg | Freq: Once | INTRAMUSCULAR | Status: DC | PRN

## 2023-08-04 MED ORDER — LACTATED RINGERS IV SOLN: INTRAVENOUS | Status: DC

## 2023-08-04 MED ORDER — ONDANSETRON HCL 4 MG/2ML IJ SOLN
INTRAMUSCULAR | Status: DC | PRN
  Administered 2023-08-04: 4 mg via INTRAVENOUS

## 2023-08-04 MED ORDER — CEFAZOLIN SODIUM-DEXTROSE 2-4 GM/100ML-% IV SOLN
INTRAVENOUS | Status: AC
  Filled 2023-08-04: qty 100

## 2023-08-04 MED ORDER — AMISULPRIDE (ANTIEMETIC) 5 MG/2ML IV SOLN
10.0000 mg | Freq: Once | INTRAVENOUS | Status: AC | PRN
  Administered 2023-08-04: 10 mg via INTRAVENOUS

## 2023-08-04 MED ORDER — FENTANYL CITRATE (PF) 100 MCG/2ML IJ SOLN
100.0000 ug | Freq: Once | INTRAMUSCULAR | Status: AC
  Administered 2023-08-04: 100 ug via INTRAVENOUS

## 2023-08-04 MED ORDER — ACETAMINOPHEN 500 MG PO TABS
ORAL_TABLET | ORAL | Status: AC
  Filled 2023-08-04: qty 2

## 2023-08-04 MED ORDER — OXYCODONE HCL 5 MG PO TABS: 5.0000 mg | ORAL_TABLET | Freq: Once | ORAL | Status: DC | PRN

## 2023-08-04 MED ORDER — AMISULPRIDE (ANTIEMETIC) 5 MG/2ML IV SOLN
INTRAVENOUS | Status: AC
  Filled 2023-08-04: qty 4

## 2023-08-04 MED ORDER — HYDROMORPHONE HCL 1 MG/ML IJ SOLN: 0.2500 mg | INTRAMUSCULAR | Status: DC | PRN

## 2023-08-04 MED ORDER — FENTANYL CITRATE (PF) 100 MCG/2ML IJ SOLN
INTRAMUSCULAR | Status: AC
Start: 2023-08-04 — End: 2023-08-04
  Filled 2023-08-04: qty 2

## 2023-08-04 MED ORDER — ONDANSETRON HCL 4 MG/2ML IJ SOLN
INTRAMUSCULAR | Status: AC
  Filled 2023-08-04: qty 2

## 2023-08-04 MED ORDER — MIDAZOLAM HCL 2 MG/2ML IJ SOLN
INTRAMUSCULAR | Status: AC
Start: 2023-08-04 — End: 2023-08-04
  Filled 2023-08-04: qty 2

## 2023-08-04 MED ORDER — ONDANSETRON HCL 4 MG/2ML IJ SOLN: 4.0000 mg | Freq: Once | INTRAMUSCULAR | Status: DC | PRN

## 2023-08-04 MED ORDER — TRAMADOL HCL 50 MG PO TABS: 50.0000 mg | ORAL_TABLET | Freq: Four times a day (QID) | ORAL | 0 refills | Status: AC | PRN

## 2023-08-04 MED ORDER — PROPOFOL 500 MG/50ML IV EMUL
INTRAVENOUS | Status: DC | PRN
Start: 2023-08-04 — End: 2023-08-04
  Administered 2023-08-04: 100 ug/kg/min via INTRAVENOUS

## 2023-08-04 MED ORDER — OXYCODONE HCL 5 MG/5ML PO SOLN: 5.0000 mg | Freq: Once | ORAL | Status: DC | PRN

## 2023-08-04 MED ORDER — CEFAZOLIN SODIUM-DEXTROSE 2-4 GM/100ML-% IV SOLN
2.0000 g | INTRAVENOUS | Status: AC
  Administered 2023-08-04: 2 g via INTRAVENOUS

## 2023-08-04 SURGICAL SUPPLY — 41 items
BIT DRILL 1.1X60MM (BIT) IMPLANT
BLADE SURG 15 STRL LF DISP TIS (BLADE) ×2 IMPLANT
BNDG COMPR ESMARK 4X3 LF (GAUZE/BANDAGES/DRESSINGS) ×1 IMPLANT
BNDG ELASTIC 3INX 5YD STR LF (GAUZE/BANDAGES/DRESSINGS) ×1 IMPLANT
BNDG GAUZE DERMACEA FLUFF 4 (GAUZE/BANDAGES/DRESSINGS) ×1 IMPLANT
CHLORAPREP W/TINT 26 (MISCELLANEOUS) ×1 IMPLANT
CORD BIPOLAR FORCEPS 12FT (ELECTRODE) ×1 IMPLANT
COVER BACK TABLE 60X90IN (DRAPES) ×1 IMPLANT
COVER MAYO STAND STRL (DRAPES) ×1 IMPLANT
CUFF TOURN SGL QUICK 18X4 (TOURNIQUET CUFF) ×1 IMPLANT
DRAPE EXTREMITY T 121X128X90 (DISPOSABLE) ×1 IMPLANT
DRAPE OEC MINIVIEW 54X84 (DRAPES) ×1 IMPLANT
DRAPE SURG 17X23 STRL (DRAPES) ×1 IMPLANT
DRIVER BIT 1.5 (TRAUMA) IMPLANT
GAUZE SPONGE 4X4 12PLY STRL (GAUZE/BANDAGES/DRESSINGS) ×1 IMPLANT
GAUZE XEROFORM 1X8 LF (GAUZE/BANDAGES/DRESSINGS) ×1 IMPLANT
GLOVE BIO SURGEON STRL SZ7.5 (GLOVE) ×1 IMPLANT
GLOVE BIOGEL PI IND STRL 8 (GLOVE) ×1 IMPLANT
GOWN STRL REUS W/ TWL LRG LVL3 (GOWN DISPOSABLE) ×1 IMPLANT
GOWN STRL REUS W/TWL XL LVL3 (GOWN DISPOSABLE) ×1 IMPLANT
NDL HYPO 25X1 1.5 SAFETY (NEEDLE) IMPLANT
NEEDLE HYPO 25X1 1.5 SAFETY (NEEDLE) IMPLANT
NS IRRIG 1000ML POUR BTL (IV SOLUTION) ×1 IMPLANT
PACK BASIN DAY SURGERY FS (CUSTOM PROCEDURE TRAY) ×1 IMPLANT
PAD CAST 4YDX4 CTTN HI CHSV (CAST SUPPLIES) ×1 IMPLANT
SCREW NL 1.5X12 (Screw) IMPLANT
SCREW NL 1.5X13 (Screw) IMPLANT
SLEEVE SCD COMPRESS KNEE MED (STOCKING) IMPLANT
SPLINT PLASTER CAST XFAST 3X15 (CAST SUPPLIES) IMPLANT
SPLINT PLASTER CAST XFAST 4X15 (CAST SUPPLIES) IMPLANT
STOCKINETTE 4X48 STRL (DRAPES) ×1 IMPLANT
SUT CHROMIC 4 0 RB 1X27 (SUTURE) IMPLANT
SUT ETHILON 3 0 PS 1 (SUTURE) IMPLANT
SUT ETHILON 4 0 PS 2 18 (SUTURE) ×1 IMPLANT
SUT MERSILENE 4 0 P 3 (SUTURE) IMPLANT
SUT VIC AB 3-0 PS1 18XBRD (SUTURE) IMPLANT
SUT VIC AB 4-0 PS2 18 (SUTURE) ×1 IMPLANT
SYR BULB EAR ULCER 3OZ GRN STR (SYRINGE) ×1 IMPLANT
SYR CONTROL 10ML LL (SYRINGE) IMPLANT
TOWEL GREEN STERILE FF (TOWEL DISPOSABLE) ×2 IMPLANT
UNDERPAD 30X36 HEAVY ABSORB (UNDERPADS AND DIAPERS) ×1 IMPLANT

## 2023-08-04 NOTE — Transfer of Care (Signed)
 Immediate Anesthesia Transfer of Care Note  Patient: Connie Avery  Procedure(s) Performed: OPEN REDUCTION INTERNAL FIXATION (ORIF) METACARPAL (Right: Hand)  Patient Location: PACU  Anesthesia Type:MAC and Regional  Level of Consciousness: awake, alert , and patient cooperative  Airway & Oxygen Therapy: Patient Spontanous Breathing  Post-op Assessment: Report given to RN and Post -op Vital signs reviewed and stable  Post vital signs: Reviewed and stable  Last Vitals:  Vitals Value Taken Time  BP 99/85 08/04/23 16:16  Temp    Pulse 72 08/04/23 16:17  Resp 15 08/04/23 16:17  SpO2 94 % 08/04/23 16:17  Vitals shown include unfiled device data.  Last Pain:  Vitals:   08/04/23 1317  TempSrc: Temporal  PainSc: 0-No pain      Patients Stated Pain Goal: 3 (08/04/23 1317)  Complications: No notable events documented.

## 2023-08-04 NOTE — Anesthesia Procedure Notes (Signed)
 Anesthesia Regional Block: Supraclavicular block   Pre-Anesthetic Checklist: , timeout performed,  Correct Patient, Correct Site, Correct Laterality,  Correct Procedure, Correct Position, site marked,  Risks and benefits discussed,  Surgical consent,  Pre-op evaluation,  At surgeon's request and post-op pain management  Laterality: Right  Prep: Maximum Sterile Barrier Precautions used, chloraprep       Needles:  Injection technique: Single-shot  Needle Type: Echogenic Stimulator Needle     Needle Length: 9cm  Needle Gauge: 22     Additional Needles:   Procedures:,,,, ultrasound used (permanent image in chart),,    Narrative:  Start time: 08/04/2023 2:35 PM End time: 08/04/2023 2:40 PM Injection made incrementally with aspirations every 5 mL.  Performed by: Personally  Anesthesiologist: Merla Almarie HERO, DO  Additional Notes: Monitors applied. No increased pain on injection. No increased resistance to injection. Injection made in 5cc increments. Good needle visualization. Patient tolerated procedure well.

## 2023-08-04 NOTE — Progress Notes (Signed)
Assisted Dr. Finucane with right, supraclavicular, ultrasound guided block. Side rails up, monitors on throughout procedure. See vital signs in flow sheet. Tolerated Procedure well. 

## 2023-08-04 NOTE — Anesthesia Postprocedure Evaluation (Signed)
 Anesthesia Post Note  Patient: Anam Pokornicki [facility]  Procedure(s) Performed: OPEN REDUCTION INTERNAL FIXATION (ORIF) METACARPAL (Right: Hand)     Patient location during evaluation: PACU Anesthesia Type: Regional and MAC Level of consciousness: awake and alert, oriented and patient cooperative Pain management: pain level controlled Vital Signs Assessment: post-procedure vital signs reviewed and stable Respiratory status: spontaneous breathing, nonlabored ventilation and respiratory function stable Cardiovascular status: blood pressure returned to baseline and stable Postop Assessment: no apparent nausea or vomiting Anesthetic complications: no   No notable events documented.  Last Vitals:  Vitals:   08/04/23 1435 08/04/23 1615  BP:  99/85  Pulse: 78 73  Resp: (!) 24 15  Temp:  36.8 C  SpO2: 96% 94%    Last Pain:  Vitals:   08/04/23 1615  TempSrc:   PainSc: 0-No pain                 Almarie CHRISTELLA Marchi

## 2023-08-04 NOTE — Op Note (Signed)
 NAME: Connie Avery MEDICAL RECORD NO: [id number] DATE OF BIRTH: April 08, 1965 FACILITY: [facility] LOCATION: [facility] PHYSICIAN: Vahe Pienta R. Hillary Schwegler, MD   OPERATIVE REPORT   DATE OF PROCEDURE: 08/04/23    PREOPERATIVE DIAGNOSIS: Right small finger metacarpal shaft fracture   POSTOPERATIVE DIAGNOSIS: Right small finger metacarpal shaft fracture   PROCEDURE: Open reduction internal fixation right small finger metacarpal shaft fracture   SURGEON:  Franky Curia, M.D.   ASSISTANT: Arley Curia, MD   ANESTHESIA:  Regional with sedation   INTRAVENOUS FLUIDS:  Per anesthesia flow sheet.   ESTIMATED BLOOD LOSS:  Minimal.   COMPLICATIONS:  None.   SPECIMENS:  none   TOURNIQUET TIME:    Total Tourniquet Time Documented: Upper Arm (Right) - 34 minutes Total: Upper Arm (Right) - 34 minutes    DISPOSITION:  Stable to PACU.   INDICATIONS: 58 year old female with right small finger metacarpal shaft fracture.  She wishes to proceed with operative reduction and fixation.  Risks, benefits and alternatives of surgery were discussed including the risks of blood loss, infection, damage to nerves, vessels, tendons, ligaments, bone for surgery, need for additional surgery, complications with wound healing, continued pain, stiffness, , nonunion, malunion.  She voiced understanding of these risks and elected to proceed.  OPERATIVE COURSE:  After being identified preoperatively by myself,  the patient and I agreed on the procedure and site of the procedure.  The surgical site was marked.  Surgical consent had been signed. Preoperative IV antibiotic prophylaxis was given. She was transferred to the operating room and placed on the operating table in supine position with the Right upper extremity on an arm board.  Sedation was induced by the anesthesiologist. A regional block had been performed by anesthesia in preoperative holding.    Right upper extremity was prepped and draped  in normal sterile orthopedic fashion.  A surgical pause was performed between the surgeons, anesthesia, and operating room staff and all were in agreement as to the patient, procedure, and site of procedure.  Tourniquet at the proximal aspect of the extremity was inflated to 250 mmHg after exsanguination of the arm with an Esmarch bandage.  Incision was made over the right small finger metacarpal dorsally.  This was carried in subcutaneous tissues by spreading technique.  Bipolar electrocautery was used to obtain hemostasis.  The extensor tendons were retracted radially.  The periosteum was sharply incised and elevated with the freer elevator.  Fracture was identified.  Was cleared of soft tissue and hematoma interposition.  It was reduced under direct visualization and provisionally held with the pharyngeal clamps.  C-arm was used in AP and lateral projections to ensure appropriate reduction which was the case.  The ALPS set was used.  1.5 millimeter screws were placed in standard AO drilling measuring technique.  3 screws were placed across the fracture.  This was adequate to stabilize the fracture and good reduction.  Wrist was placed through tenodesis and there was no scissoring.  The C-arm was used in AP and lateral and oblique projections to ensure appropriate reduction and position of hardware as was the case.  The wound was copiously irrigated with sterile saline.  The periosteum was closed with a running 4-0 chromic suture.  Inverted interrupted 4-0 Vicryl suture was placed in subcutaneous tissues and the skin was closed with 4-0 nylon in a horizontal mattress fashion.  The wound was dressed with sterile Xeroform and 4 x 4's and wrapped with a Kerlix bandage.  Volar and  dorsal slab splint including the long ring and small fingers was placed with the MPs flexed and the IP's extended.  This was wrapped with Kerlix and Ace bandage.  The tourniquet was deflated at 34 minutes.  Fingertips were pink with brisk  capillary refill after deflation of tourniquet.  The operative  drapes were broken down.  The patient was awoken from anesthesia safely.  She was transferred back to the stretcher and taken to PACU in stable condition.  I will see her back in the office in 1 week for postoperative followup.  I will give her a prescription for Tramadol  50 mg 1 tab PO q6 hours prn pain, dispense #15.   Bren Borys, MD Electronically signed, 08/04/23

## 2023-08-04 NOTE — H&P (Signed)
 Kaneisha Pokornicki [place] is an 58 y.o. female.   Chief Complaint: metacarpal fracture HPI: 58 yo female with right small finger metacarpal fracture.  Not sure if she hit her hand on something.  Has decreased use of the hand due to previous stroke.  Seen in ED where XR revealed small finger metacarpal fracture.  She wishes to proceed with surgical fixation.  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 She has never used smokeless tobacco. She reports that she does not drink alcohol and does not use drugs.  Medications: Medications Prior to Admission  Medication Sig Dispense Refill   aspirin 81 MG tablet Take 81 mg by mouth daily.     Multiple Vitamin CHEW Chew 2 each by mouth daily.     simvastatin  (ZOCOR ) 20 MG tablet Take 1 tablet (20 mg total) by mouth every evening. 30 tablet 5   vitamin B-12 (CYANOCOBALAMIN) 50 MCG tablet Take 500 mcg by mouth daily.     Omega-3 Fatty Acids (FISH OIL) 1000 MG CAPS Take 1 each by mouth daily.      No results found for this or any previous visit (from the past 48 hours).  DG MINI C-ARM IMAGE ONLY Result Date: 08/04/2023 There is no interpretation for this exam.  This order is for images obtained during a surgical procedure.  Please See Surgeries Tab for more information  regarding the procedure.      Blood pressure 128/71, pulse 77, temperature 98.4 F (36.9 C), temperature source Temporal, height 5' 5 (1.651 m), weight 58.2 kg, SpO2 97%.  General appearance: alert, cooperative, and appears stated age Head: Normocephalic, without obvious abnormality, atraumatic Neck: supple, symmetrical, trachea midline Extremities: Intact sensation and capillary refill all digits.  +epl/fpl/io.  No wounds.  Skin: Skin color, texture, turgor normal. No rashes or lesions Neurologic: Grossly normal Incision/Wound: none  Assessment/Plan Right small finger metacarpal fracture.  Non operative and operative treatment options have been discussed with the patient and patient wishes to proceed with operative treatment. Risks, benefits and alternatives of surgery were discussed including risks of blood loss, infection, damage to nerves/vessels/tendons/ligament/bone, failure of surgery, need for additional surgery, complication with wound healing, stiffness, nonunion, malunion.  She voiced understanding of these risks and elected to proceed.    Maddisyn Hegwood 08/04/2023, 2:15 PM

## 2023-08-04 NOTE — Discharge Instructions (Addendum)
 [facility] Center Instructions Hand Surgery  Wound Care: Keep your hand elevated above the level of your heart.  Do not allow it to dangle by your side.  Keep the dressing dry and do not remove it unless your doctor advises you to do so.  He will usually change it at the time of your post-op visit.  Moving your fingers is advised to stimulate circulation but will depend on the site of your surgery.  If you have a splint applied, your doctor will advise you regarding movement.  Activity: Do not drive or operate machinery today.  Rest today and then you may return to your normal activity and work as indicated by your physician.  Diet:  Drink liquids today or eat a light diet.  You may resume a regular diet tomorrow.    General expectations: Pain for two to three days. Fingers may become slightly swollen.  Call your doctor if any of the following occur: Severe pain not relieved by pain medication. Elevated temperature. Dressing soaked with blood. Inability to move fingers. White or bluish color to fingers.    Post Anesthesia Home Care Instructions  Activity: Get plenty of rest for the remainder of the day. A responsible individual must stay with you for 24 hours following the procedure.  For the next 24 hours, DO NOT: -Drive a car -Advertising copywriter -Drink alcoholic beverages -Take any medication unless instructed by your physician -Make any legal decisions or sign important papers.  Meals: Start with liquid foods such as gelatin or soup. Progress to regular foods as tolerated. Avoid greasy, spicy, heavy foods. If nausea and/or vomiting occur, drink only clear liquids until the nausea and/or vomiting subsides. Call your physician if vomiting continues.  Special Instructions/Symptoms: Your throat may feel dry or sore from the anesthesia or the breathing tube placed in your throat during surgery. If this causes discomfort, gargle with warm salt water. The discomfort should disappear  within 24 hours.  May have Tylenol  after 7:20pm if needed.     Regional Anesthesia Blocks  1. You may not be able to move or feel the blocked extremity after a regional anesthetic block. This may last may last from 3-48 hours after placement, but it will go away. The length of time depends on the medication injected and your individual response to the medication. As the nerves start to wake up, you may experience tingling as the movement and feeling returns to your extremity. If the numbness and inability to move your extremity has not gone away after 48 hours, please call your surgeon.   2. The extremity that is blocked will need to be protected until the numbness is gone and the strength has returned. Because you cannot feel it, you will need to take extra care to avoid injury. Because it may be weak, you may have difficulty moving it or using it. You may not know what position it is in without looking at it while the block is in effect.  3. For blocks in the legs and feet, returning to weight bearing and walking needs to be done carefully. You will need to wait until the numbness is entirely gone and the strength has returned. You should be able to move your leg and foot normally before you try and bear weight or walk. You will need someone to be with you when you first try to ensure you do not fall and possibly risk injury.  4. Bruising and tenderness at the needle site are common side  effects and will resolve in a few days.  5. Persistent numbness or new problems with movement should be communicated to the surgeon or the [facility] [phone] [facility] [phone]).

## 2023-08-04 NOTE — Anesthesia Preprocedure Evaluation (Addendum)
 Anesthesia Evaluation  Patient identified by MRN, date of birth, ID band Patient awake    Reviewed: Allergy & Precautions, NPO status , Patient's Chart, lab work & pertinent test results  History of Anesthesia Complications (+) PONV and history of anesthetic complications  Airway Mallampati: I  TM Distance: >3 FB Neck ROM: Full    Dental  (+) Teeth Intact, Dental Advisory Given   Pulmonary neg pulmonary ROS   Pulmonary exam normal breath sounds clear to auscultation       Cardiovascular negative cardio ROS Normal cardiovascular exam Rhythm:Regular Rate:Normal     Neuro/Psych CVA (2011, numbness R side), Residual Symptoms  negative psych ROS   GI/Hepatic negative GI ROS, Neg liver ROS,,,  Endo/Other  negative endocrine ROS    Renal/GU negative Renal ROS  negative genitourinary   Musculoskeletal negative musculoskeletal ROS (+)    Abdominal   Peds  Hematology negative hematology ROS (+)   Anesthesia Other Findings   Reproductive/Obstetrics negative OB ROS                              Anesthesia Physical Anesthesia Plan  ASA: 2  Anesthesia Plan: MAC and Regional   Post-op Pain Management: Regional block* and Tylenol  PO (pre-op)*   Induction:   PONV Risk Score and Plan: 2 and Propofol  infusion and TIVA  Airway Management Planned: Natural Airway and Simple Face Mask  Additional Equipment: None  Intra-op Plan:   Post-operative Plan:   Informed Consent: I have reviewed the patients History and Physical, chart, labs and discussed the procedure including the risks, benefits and alternatives for the proposed anesthesia with the patient or authorized representative who has indicated his/her understanding and acceptance.       Plan Discussed with: CRNA  Anesthesia Plan Comments: (Some pre-existing numbness R hand from CVA, but pt unsure if it will be enough numbness to get her  through postop so she has elected for regional anesthesia. Strong preference for light-mod sedation instead of GA so I agree regional anesthesia will benefit her.)         Anesthesia Quick Evaluation

## 2023-08-04 NOTE — Op Note (Signed)
 I assisted Surgeons and Role:    * Murrell Drivers, MD - Primary    DEWAINE Murrell Kuba, MD - Assisting on the Procedure(s): OPEN REDUCTION INTERNAL FIXATION (ORIF) METACARPAL on 08/04/2023.  I provided assistance on this case as follows: Set up, approach, retraction for protection of the tendons and nerves, identification of the fracture, mobilization of the fracture, debridement of the fracture, reduction of the fracture, stabilization of the fracture, fixation of the fracture with screws, closure of the wound and application of the dressing and splints.  Electronically signed by: Kuba Murrell, MD Date: 08/04/2023 Time: 4:14 PM

## 2023-08-05 ENCOUNTER — Encounter (HOSPITAL_BASED_OUTPATIENT_CLINIC_OR_DEPARTMENT_OTHER): Payer: Self-pay | Admitting: Orthopedic Surgery

## 2023-09-21 ENCOUNTER — Encounter: Payer: Self-pay | Admitting: Podiatry

## 2023-09-21 ENCOUNTER — Ambulatory Visit: Admitting: Podiatry

## 2023-09-21 VITALS — Ht 65.0 in | Wt 128.0 lb

## 2023-09-21 DIAGNOSIS — L600 Ingrowing nail: Secondary | ICD-10-CM

## 2023-09-21 DIAGNOSIS — M79675 Pain in left toe(s): Secondary | ICD-10-CM

## 2023-09-21 DIAGNOSIS — M79674 Pain in right toe(s): Secondary | ICD-10-CM | POA: Diagnosis not present

## 2023-09-21 DIAGNOSIS — B351 Tinea unguium: Secondary | ICD-10-CM

## 2023-09-21 NOTE — Progress Notes (Signed)
 Subjective:   Patient ID: Connie Avery, female   DOB: 58 y.o.   MRN: [id number]   HPI Patient presents with a loose discolored big toenail right that is been thick and has been present for a number of years.  She has had a stroke so as a result she does somewhat drag her right foot and puts a lot of pressure on it.  Patient does not currently smoke likes to be active   Review of Systems  All other systems reviewed and are negative.       Objective:  Physical Exam Vitals and nursing note reviewed.  Constitutional:      Appearance: She is well-developed.  Pulmonary:     Effort: Pulmonary effort is normal.  Musculoskeletal:        General: Normal range of motion.  Skin:    General: Skin is warm.  Neurological:     Mental Status: She is alert.     Neurovascular status intact muscle strength adequate range of motion adequate with a thickened deformed right hallux nail and all nails found to be moderately elongated with thickness and discomfort.  The right hallux nail has significant discoloration of the bed and is loose     Assessment:  Mycotic nail infection 1-5 both feet with a loose big toenail right with obvious trauma     Plan:  H&P reviewed at this point I have recommended the consideration of nail removal and I discussed it with her and caregivers the pros and cons of doing this.  Patient's dental hold off currently but this is something that we may need to do long-term and I did educate her on permanent procedure.  Today I debrided nailbeds 1-5 both feet no iatrogenic bleeding reappoint routine care
